# Patient Record
Sex: Female | Born: 1940 | Race: White | Hispanic: No | Marital: Married | Smoking: Never smoker
Health system: Southern US, Community
[De-identification: ages and names within clinical notes are randomized; demographics above are authoritative.]

## PROBLEM LIST (undated history)

## (undated) DIAGNOSIS — L905 Scar conditions and fibrosis of skin: Secondary | ICD-10-CM

## (undated) DIAGNOSIS — K219 Gastro-esophageal reflux disease without esophagitis: Secondary | ICD-10-CM

## (undated) DIAGNOSIS — K589 Irritable bowel syndrome without diarrhea: Secondary | ICD-10-CM

## (undated) DIAGNOSIS — Z889 Allergy status to unspecified drugs, medicaments and biological substances status: Secondary | ICD-10-CM

## (undated) HISTORY — DX: Allergy status to unspecified drugs, medicaments and biological substances: Z88.9

## (undated) HISTORY — DX: Irritable bowel syndrome, unspecified: K58.9

## (undated) HISTORY — DX: Scar conditions and fibrosis of skin: L90.5

## (undated) HISTORY — DX: Gastro-esophageal reflux disease without esophagitis: K21.9

---

## 1997-03-25 DIAGNOSIS — C4491 Basal cell carcinoma of skin, unspecified: Secondary | ICD-10-CM

## 1997-03-25 HISTORY — DX: Basal cell carcinoma of skin, unspecified: C44.91

## 1999-09-17 DIAGNOSIS — D229 Melanocytic nevi, unspecified: Secondary | ICD-10-CM

## 1999-09-17 HISTORY — DX: Melanocytic nevi, unspecified: D22.9

## 2002-12-30 HISTORY — PX: TUBAL LIGATION: SHX77

## 2006-07-28 DIAGNOSIS — C4492 Squamous cell carcinoma of skin, unspecified: Secondary | ICD-10-CM

## 2006-07-28 HISTORY — DX: Squamous cell carcinoma of skin, unspecified: C44.92

## 2012-05-04 ENCOUNTER — Ambulatory Visit (INDEPENDENT_AMBULATORY_CARE_PROVIDER_SITE_OTHER): Payer: Medicare Other | Admitting: Internal Medicine

## 2012-05-04 ENCOUNTER — Encounter: Payer: Self-pay | Admitting: Internal Medicine

## 2012-05-04 VITALS — BP 126/86 | HR 99 | Temp 98.5°F | Ht 64.0 in | Wt 151.8 lb

## 2012-05-04 DIAGNOSIS — R05 Cough: Secondary | ICD-10-CM

## 2012-05-04 DIAGNOSIS — J479 Bronchiectasis, uncomplicated: Secondary | ICD-10-CM | POA: Insufficient documentation

## 2012-05-04 DIAGNOSIS — J9819 Other pulmonary collapse: Secondary | ICD-10-CM

## 2012-05-04 NOTE — Progress Notes (Signed)
  Subjective:    Patient ID: Latoya Horton, female    DOB: 1941/08/22  MRN: [id number]  HPI  89 yowf never smoker with no respiratory problems x for eye allergies dx in [place] with allergy shots x 1990's  Only with occular complaints  until Dec 2012 with ? Viral pneumonia/ cough since >  referred by Dr Criss Alvine 05/04/2012 to pulmonary clinic for eval of abn cxr/chronic cough.   05/04/2012 1st pulmonary eval cc acute onset cough since Dec 2012 "viral infection" overall 90% better ? If inhaler helped but no longer on it,  ? With exercise if gets supine, does ok walking 2 miles outdoors. No flare of allergy symptoms with this illness. Also new L flank post lat chest only if lie on it. Mucus was yellow now white, was first thing in am then resolved.  Sleeping ok without nocturnal  or early am exacerbation  of respiratory  c/o's or need for noct saba. Also denies any obvious fluctuation of symptoms with weather or environmental changes or other aggravating or alleviating factors except as outlined above.    Review of Systems  Constitutional: Negative for fever, chills and unexpected weight change.  HENT: Negative for ear pain, nosebleeds, congestion, sore throat, rhinorrhea, sneezing, trouble swallowing, dental problem, voice change, postnasal drip and sinus pressure.   Eyes: Negative for visual disturbance.  Respiratory: Positive for cough. Negative for choking and shortness of breath.   Cardiovascular: Negative for chest pain and leg swelling.  Gastrointestinal: Negative for vomiting, abdominal pain and diarrhea.  Genitourinary: Negative for difficulty urinating.  Musculoskeletal: Negative for arthralgias.  Skin: Negative for rash.  Neurological: Negative for tremors, syncope and headaches.  Hematological: Does not bruise/bleed easily.       Objective:   Physical Exam  Pleasant amb wf nad 151 05/04/2012   HEENT: nl dentition, turbinates, and orophanx. Nl external ear canals without cough  reflex   NECK :  without JVD/Nodes/TM/ nl carotid upstrokes bilaterally   LUNGS: no acc muscle use, clear to A and P bilaterally without cough on insp or exp maneuvers   CV:  RRR  no s3 or murmur or increase in P2, no edema   ABD:  soft and nontender with nl excursion in the supine position. No bruits or organomegaly, bowel sounds nl  MS:  warm without deformities, calf tenderness, cyanosis or clubbing  SKIN: warm and dry without lesions    NEURO:  alert, approp, no deficits        Assessment & Plan:

## 2012-05-04 NOTE — Patient Instructions (Signed)
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  mucinex 600 mg 1-2 every 12 hours as needed for cough or congestion  GERD (REFLUX)  is an extremely common cause of respiratory symptoms, many times with no significant heartburn at all.    It can be treated with medication, but also with lifestyle changes including avoidance of late meals, excessive alcohol, smoking cessation, and avoid fatty foods, chocolate, peppermint, colas, red wine, and acidic juices such as orange juice.  NO MINT OR MENTHOL PRODUCTS SO NO COUGH DROPS  USE SUGARLESS CANDY INSTEAD (jolley ranchers or Stover's)  NO OIL BASED VITAMINS - use powdered substitutes.    Please schedule a follow up office visit in 6 weeks, call sooner if needed with pft's on return.

## 2012-05-05 NOTE — Assessment & Plan Note (Signed)
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 Bronchodilator trial

## 2012-06-17 ENCOUNTER — Ambulatory Visit (INDEPENDENT_AMBULATORY_CARE_PROVIDER_SITE_OTHER): Payer: Medicare Other | Admitting: Internal Medicine

## 2012-06-17 ENCOUNTER — Encounter: Payer: Self-pay | Admitting: Internal Medicine

## 2012-06-17 VITALS — BP 140/82 | HR 68 | Temp 97.4°F | Ht 64.0 in | Wt 150.0 lb

## 2012-06-17 DIAGNOSIS — R05 Cough: Secondary | ICD-10-CM

## 2012-06-17 DIAGNOSIS — R079 Chest pain, unspecified: Secondary | ICD-10-CM | POA: Insufficient documentation

## 2012-06-17 DIAGNOSIS — J479 Bronchiectasis, uncomplicated: Secondary | ICD-10-CM

## 2012-06-17 DIAGNOSIS — J9819 Other pulmonary collapse: Secondary | ICD-10-CM

## 2012-06-17 DIAGNOSIS — I209 Angina pectoris, unspecified: Secondary | ICD-10-CM

## 2012-06-17 LAB — PULMONARY FUNCTION TEST

## 2012-06-17 MED ORDER — NEBIVOLOL HCL 5 MG PO TABS: 5.0000 mg | ORAL_TABLET | Freq: Every day | ORAL | Status: DC

## 2012-06-17 MED ORDER — FAMOTIDINE 20 MG PO TABS: ORAL_TABLET | ORAL | Status: DC

## 2012-06-17 MED ORDER — OMEPRAZOLE MAGNESIUM 20 MG PO TBEC: 20.0000 mg | DELAYED_RELEASE_TABLET | Freq: Every day | ORAL | Status: DC

## 2012-06-17 NOTE — Patient Instructions (Addendum)
bystolic 5 mg one daily  Try prilosec 20mg   Take 30-60 min before first meal of the day and Pepcid 20 mg one bedtime until  Your return  GERD (REFLUX)  is an extremely common cause of respiratory symptoms, many times with no significant heartburn at all.    It can be treated with medication, but also with lifestyle changes including avoidance of late meals, excessive alcohol, smoking cessation, and avoid fatty foods, chocolate, peppermint, colas, red wine, and acidic juices such as orange juice.  NO MINT OR MENTHOL PRODUCTS SO NO COUGH DROPS  USE SUGARLESS CANDY INSTEAD (jolley ranchers or Stover's)  NO OIL BASED VITAMINS - use powdered substitutes.    Don't exercise to point where you're having discomfort  Schedule a cardiac evaluation in [place] as soon as possible for chest discomfort with exertion x 2 weeks.  Bronchiectasis =   you have scarring of your bronchial tubes which means that they don't function perfectly normally and mucus tends to pool in certain areas of your lung which can cause pneumonia and further scarring of your lung and bronchial tubes  Whenever you develop cough congestion take mucinex or mucinex dm > these will help keep the mucus loose and flowing but if your condition worsens you need to seek help immediately preferably here or somewhere inside the [facility] system to compare xrays ( worse = darker or bloody mucus or pain on breathing in)   Please schedule a follow up office visit in 6 weeks, call sooner if needed

## 2012-06-17 NOTE — Assessment & Plan Note (Signed)
Referred to [facility] 06/17/2012 p placed on bystolic 5 mg one daily  Discussed ddx but this is angina until proven otherwise; could also be gerd with ex if cards w/u neg

## 2012-06-17 NOTE — Assessment & Plan Note (Signed)
-   CXR 02/2009 min changes in RML vs Lingula     -  Outside CT 04/13/12 c/w rml > lingular  Bronchiectasis    -  PFT's .06/17/2012 FEV1  1.90 (96%) ratio 73 and no change p B2,  DLCO 98%  I had an extended discussion with the patient today lasting 15 to 20 minutes of a 25 minute visit on the following issues:   Discussed what this means in terms of symptoms and best rx to treat. Got pneumovax w/in last few years and per guidelines at age 71 probably doesn't need it again  Absence of sign airflow obstruction rules out alpha one def  Assoc daytime choking may be GERD > rx with ppi/h2 hs and diet and regroup in 6 weeks

## 2012-06-17 NOTE — Progress Notes (Signed)
PFT done today. 

## 2012-06-17 NOTE — Progress Notes (Signed)
  Subjective:    Patient ID: Latoya Horton, female    DOB: 01-28-1941  MRN: [id number]  Brief patient profile:  77 yowf never smoker with no respiratory problems x for eye allergies dx in [place] with allergy shots x 1990's  Only with occular complaints  until Dec 2012 with ? Viral pneumonia/ cough since >  referred by Dr Criss Alvine 05/04/2012 to pulmonary clinic for eval of abn cxr/chronic cough.   HPI 05/04/2012 1st pulmonary eval cc acute onset cough since Dec 2012 "viral infection" overall 90% better ? If inhaler helped but no longer on it,  ? With exercise if gets supine, does ok walking 2 miles outdoors. No flare of allergy symptoms with this illness. Also new L flank post lat chest only if lie on it. Mucus was yellow now white, was first thing in am then resolved. 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 mucinex 600 mg 1-2 every 12 hours as needed for cough or congestion GERD diet    06/17/2012 f/u ov/Jerod Mcquain cc midline cp at the end of exertion first noted x 2 weeks, has choking episodes also, only daytime with sense of too much throat mucus but no  Excess mucus production, following gerd diet with no overt hb or sinus symptoms or variability to symptoms.  CP does not radiate, some worse with deep breath, only with exertion, resolves when rests after a few min, no assoc nausea or diaphoresis.  Fm hx pos ihd father around 86, neg cardiac stress test "4 years ago in [place]".    Sleeping ok without nocturnal  or early am exacerbation  of respiratory  c/o's or need for noct saba. Also denies any obvious fluctuation of symptoms with weather or environmental changes or other aggravating or alleviating factors except as outlined above.  ROS  At present neg for  any significant sore throat, dysphagia, dental problems, itching, sneezing,  nasal congestion or excess/ purulent secretions, ear ache,   fever, chills, sweats,  unintended wt loss, classically lateralizing  Pleuritic , hemoptysis, palpitations, orthopnea pnd or leg swelling.  Also denies presyncope, palpitations, heartburn, abdominal pain, anorexia, nausea, vomiting, diarrhea  or change in bowel or urinary habits, change in stools or urine, dysuria,hematuria,  rash, arthralgias, visual complaints, headache, numbness weakness or ataxia or problems with walking or coordination. No noted change in mood/affect or memory.         Objective:  Physical Exam  Pleasant amb wf nad 151 05/04/2012  > 06/17/2012  150  HEENT: nl dentition, turbinates, and orophanx. Nl external ear canals without cough reflex   NECK :  without JVD/Nodes/TM/ nl carotid upstrokes bilaterally   LUNGS: no acc muscle use, clear to A and P bilaterally without cough on insp or exp maneuvers   CV:  RRR  no s3 or murmur or increase in P2, no edema   ABD:  soft and nontender with nl excursion in the supine position. No bruits or organomegaly, bowel sounds nl  MS:  warm without deformities, calf tenderness, cyanosis or clubbing  SKIN: warm and dry without lesions    NEURO:  alert, approp, no deficits        Assessment & Plan:

## 2012-06-18 ENCOUNTER — Telehealth: Payer: Self-pay | Admitting: Internal Medicine

## 2012-06-18 NOTE — Telephone Encounter (Signed)
I spoke with Latoya Horton and she is requesting to be referred to a cardiologists here instead of [place]. Latoya Horton states she will get her records from [place] and bring them with her to her OV. Dr. Sherene Sires who would you like Latoya Horton to see. Please advise thanks

## 2012-06-18 NOTE — Telephone Encounter (Signed)
Spoke with pt and notified that referral will be sent for cards eval at [facility]. She verbalized understanding and states nothing further needed.

## 2012-06-18 NOTE — Telephone Encounter (Signed)
Next avail [facility] for new onset exertional cp

## 2012-06-19 ENCOUNTER — Encounter: Payer: Self-pay | Admitting: Cardiology

## 2012-06-19 ENCOUNTER — Ambulatory Visit (INDEPENDENT_AMBULATORY_CARE_PROVIDER_SITE_OTHER): Payer: Medicare Other | Admitting: Cardiology

## 2012-06-19 VITALS — BP 134/82 | HR 62 | Ht 64.0 in | Wt 150.0 lb

## 2012-06-19 DIAGNOSIS — I209 Angina pectoris, unspecified: Secondary | ICD-10-CM

## 2012-06-19 DIAGNOSIS — R072 Precordial pain: Secondary | ICD-10-CM

## 2012-06-19 NOTE — Progress Notes (Signed)
HPI The patient presents for evaluation of burning in her throat. He adamantly says she has not had chest pain. She feels discomfort when she is out walking which he does routinely for exercise. She describes it as a burning she's taking a deep breath. She sees a pulmonologist lung scarring and was told this was not probably related to her lungs. She has been treated with omeprazole and Pepcid recently. She also had a slightly increased blood pressure and was started on diastolic. She reports that she does her walking everyday and on her second mile she'll notice the symptoms. She thinks it is mild. There is no associated nausea vomiting or diaphoresis. She doesn't have any neck, arm or substernal chest discomfort. She doesn't notice when he goes away. She never has it at rest. She doesn't describe any PND or orthopnea. She has no palpitations, presyncope or syncope. She's never had any reflux symptoms  Of note the patient does report that she had a negative exercise echocardiogram for unclear reasons in 2008 in [place].  Allergies  Allergen Reactions  . Penicillins     Syncope, rash    Current Outpatient Prescriptions  Medication Sig Dispense Refill  . Calcium Carbonate-Vitamin D (CALCIUM 600 + D PO) Take 1 tablet by mouth 2 (two) times daily.      . Cholecalciferol (D3-1000) 1000 UNITS capsule Take 2,000 Units by mouth daily.      . famotidine (PEPCID) 20 MG tablet One at bedtime      . Multiple Vitamins-Minerals (CENTRUM SILVER PO) Take 1 tablet by mouth daily.      . nebivolol (BYSTOLIC) 5 MG tablet Take 1 tablet (5 mg total) by mouth daily.  30 tablet  0  . omeprazole (PRILOSEC OTC) 20 MG tablet Take 1 tablet (20 mg total) by mouth daily.      Marland Kitchen terbinafine (LAMISIL) 250 MG tablet Take 250 mg by mouth daily.      Marland Kitchen UNABLE TO FIND Med Name: Allergy shots as directed        Past Medical History  Diagnosis Date  . Multiple allergies   . Scar tissue     of the lungs  . GERD  (gastroesophageal reflux disease)   . IBS (irritable bowel syndrome)     Past Surgical History  Procedure Date  . Tubal ligation 2004    Family History  Problem Relation Age of Onset  . Heart disease Brother 24    CHF  . Heart disease Father 82    Died age 42  . Breast cancer Paternal Aunt   . Ovarian cancer Maternal Aunt   . Diabetes Brother     History   Social History  . Marital Status: Married    Spouse Name: N/A    Number of Children: 1  . Years of Education: N/A   Occupational History  . Retired     Research scientist (medical)   Social History Main Topics  . Smoking status: Never Smoker   . Smokeless tobacco: Never Used  . Alcohol Use: No  . Drug Use: No  . Sexually Active: Not on file   Other Topics Concern  . Not on file   Social History Narrative   Lives with husband    ROS:  As stated in the HPI and negative for all other systems.  PHYSICAL EXAM BP 134/82  Pulse 62  Ht 5\' 4"  (1.626 m)  Wt 150 lb (68.04 kg)  BMI 25.75 kg/m2 GENERAL:  Well appearing  HEENT:  Pupils equal round and reactive, fundi not visualized, oral mucosa unremarkable NECK:  No jugular venous distention, waveform within normal limits, carotid upstroke brisk and symmetric, no bruits, no thyromegaly LYMPHATICS:  No cervical, inguinal adenopathy LUNGS:  Clear to auscultation bilaterally BACK:  No CVA tenderness CHEST:  Unremarkable HEART:  PMI not displaced or sustained,S1 and S2 within normal limits, no S3, no S4, no clicks, no rubs, no murmurs ABD:  Flat, positive bowel sounds normal in frequency in pitch, no bruits, no rebound, no guarding, no midline pulsatile mass, no hepatomegaly, no splenomegaly EXT:  2 plus pulses throughout, no edema, no cyanosis no clubbing SKIN:  No rashes no nodules NEURO:  Cranial nerves II through XII grossly intact, motor grossly intact throughout PSYCH:  Cognitively intact, oriented to person place and time   EKG:  Sinus rhythm, rate 72, axis within  normal limits, intervals within normal limits, no acute ST-T wave changes, PACs.  06/19/2012   ASSESSMENT AND PLAN

## 2012-06-19 NOTE — Patient Instructions (Addendum)
The current medical regimen is effective;  continue present plan and medications.  Your physician has requested that you have an exercise tolerance test. For further information please visit [email or web address]. Please also follow instruction sheet, as given.   

## 2012-06-19 NOTE — Assessment & Plan Note (Signed)
I'm going to start an exercise treadmill test. This seems to be a maximal test reproducing her usual exercises. I did have a discussion with her that if this is normal but her symptoms persist or worsen in the future she would need to come back for other testing. For now she will continue his therapies a Pepcid and Prilosec that had been prescribed as well. I think the pretest probability of obstructive coronary disease is low moderate but she does have a remarkable family history.

## 2012-06-26 ENCOUNTER — Encounter: Payer: Medicare Other | Admitting: Nurse Practitioner

## 2012-06-29 ENCOUNTER — Ambulatory Visit (INDEPENDENT_AMBULATORY_CARE_PROVIDER_SITE_OTHER): Payer: Medicare Other | Admitting: Nurse Practitioner

## 2012-06-29 ENCOUNTER — Encounter: Payer: Self-pay | Admitting: Physician Assistant

## 2012-06-29 DIAGNOSIS — I209 Angina pectoris, unspecified: Secondary | ICD-10-CM

## 2012-06-29 NOTE — Procedures (Signed)
Exercise Treadmill Test  Pre-Exercise Testing Evaluation Rhythm: normal sinus  Rate: 63   PR:  .13 QRS:  .08  QT:  .43 QTc: .44     Test  Exercise Tolerance Test Ordering MD: Angelina Sheriff, MD  Interpreting MD: Ward Givens , NP  Unique Test No: 1  Treadmill:  1  Indication for ETT: chest pain - rule out ischemia  Contraindication to ETT: No   Stress Modality: exercise - treadmill  Cardiac Imaging Performed: non   Protocol: standard Bruce - maximal  Max BP:  215/92  Max MPHR (bpm):  149 85% MPR (bpm):  126  MPHR obtained (bpm):  155 % MPHR obtained: 104%  Reached 85% MPHR (min:sec): 3:00 Total Exercise Time (min-sec):  6:00  Workload in METS:  7.0 Borg Scale: 13  Reason ETT Terminated:  desired heart rate attained    ST Segment Analysis At Rest: normal ST segments - no evidence of significant ST depression With Exercise: no evidence of significant ST depression  Other Information Arrhythmia:  No Angina during ETT:  absent (0) Quality of ETT:  diagnostic  ETT Interpretation:  normal - no evidence of ischemia by ST analysis  Comments: Good exercise tolerance with hypertensive response.  HR dropped from 155 to 139 within 1 minute of slowing down/leveling out.  No chest/throat pain or sob.  No acute ST/T changes.  Recommendations: Follow-up with PCP Lajoyce Lauber, MD) re: HTN.  She was recently placed on bystolic however didn't tolerate this.  She has her BP checked regularly at home and reports that it is always <140/90 @ rest.

## 2012-07-29 ENCOUNTER — Ambulatory Visit: Payer: Medicare Other | Admitting: Internal Medicine

## 2012-07-30 ENCOUNTER — Encounter: Payer: Self-pay | Admitting: Internal Medicine

## 2012-07-30 ENCOUNTER — Ambulatory Visit (INDEPENDENT_AMBULATORY_CARE_PROVIDER_SITE_OTHER): Payer: Medicare Other | Admitting: Internal Medicine

## 2012-07-30 VITALS — BP 130/82 | HR 69 | Temp 98.8°F | Ht 64.0 in | Wt 149.8 lb

## 2012-07-30 DIAGNOSIS — R079 Chest pain, unspecified: Secondary | ICD-10-CM

## 2012-07-30 DIAGNOSIS — J479 Bronchiectasis, uncomplicated: Secondary | ICD-10-CM

## 2012-07-30 NOTE — Assessment & Plan Note (Signed)
Referred to  Cardiology 06/17/2012 p placed on bystolic 5 mg one daily > could not tolerate - 06/30/12 GXT Good exercise tolerance with hypertensive response. HR dropped  from 155 to 139 within 1 minute of slowing down/leveling out. No  chest/throat pain or sob. No acute ST/T changes.  CP resolved prior to gxt and was not produced at study while on gerd rx so should continue at least the qam prilosec for now

## 2012-07-30 NOTE — Assessment & Plan Note (Addendum)
-   CXR 02/2009 min changes in RML vs Lingula     -  Outside CT 04/13/12 c/w rml > lingular  Bronchiectasis    -  PFT's 06/17/2012 FEV1  1.90 (96%) ratio 73 and no change p B2,  DLCO 98%  Much better overall on very conservative rx > no changes needed

## 2012-07-30 NOTE — Progress Notes (Signed)
Subjective:    Patient ID: Latoya Horton, female    DOB: 1941-10-07  MRN: [id number]  Brief patient profile:  48 yowf never smoker with no respiratory problems x for eye allergies dx in [place] with allergy shots x 1990's  Only with occular complaints  until Dec 2012 with ? Viral pneumonia/ cough since >  referred by Dr Criss Alvine 05/04/2012 to pulmonary clinic for eval of abn cxr/chronic cough.   HPI 05/04/2012 1st pulmonary eval cc acute onset cough since Dec 2012 "viral infection" overall 90% better ? If inhaler helped but no longer on it,  ? With exercise if gets supine, does ok walking 2 miles outdoors. No flare of allergy symptoms with this illness. Also new L flank post lat chest only if lie on it. Mucus was yellow now white, was first thing in am then resolved. 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 mucinex 600 mg 1-2 every 12 hours as needed for cough or congestion GERD diet    06/17/2012 f/u ov/[name] cc midline cp at the end of exertion first noted x 2 weeks, has choking episodes also, only daytime with sense of too much throat mucus but no  Excess mucus production, following gerd diet with no overt hb or sinus symptoms or variability to symptoms.  CP does not radiate, some worse with deep breath, only with exertion, resolves when rests after a few min, no assoc nausea or diaphoresis.  Fm hx pos ihd father around 4, neg cardiac stress test "4 years ago in [place]".   rec  bystolic 5 mg one daily Try prilosec 20mg   Take 30-60 min before first meal of the day and Pepcid 20 mg one bedtime until  Your return GERD diet  Don't exercise to point where you're having discomfort Schedule a cardiac evaluation in [place] as soon as possible for chest discomfort with exertion x 2 weeks. Bronchiectasis =   you have scarring of your bronchial tubes/ rx with mucinex prn   07/30/2012 f/u ov/[name] cc chest pain  resolved while on gerd rx before treadmill which was neg for ischemia,  No unusual cough, purulent sputum or sinus/hb symptoms on present rx.   Sleeping ok without nocturnal  or early am exacerbation  of respiratory  c/o's or need for noct saba. Also denies any obvious fluctuation of symptoms with weather or environmental changes or other aggravating or alleviating factors except as outlined above.  ROS  At present neg for  any significant sore throat, dysphagia, dental problems, itching, sneezing,  nasal congestion or excess/ purulent secretions, ear ache,   fever, chills, sweats, unintended wt loss, classically lateralizing  Pleuritic , hemoptysis, palpitations, orthopnea pnd or leg swelling.  Also denies presyncope, palpitations, heartburn, abdominal pain, anorexia, nausea, vomiting, diarrhea  or change in bowel or urinary habits, change in stools or urine, dysuria,hematuria,  rash, arthralgias, visual complaints, headache, numbness weakness or ataxia or problems with walking or coordination. No noted change in mood/affect or memory.         Objective:  Physical Exam  Pleasant amb wf nad 151 05/04/2012  > 06/17/2012  150 > 07/30/2012  149  HEENT: nl dentition, turbinates, and orophanx. Nl external ear canals without cough reflex   NECK :  without JVD/Nodes/TM/ nl carotid upstrokes bilaterally   LUNGS: no acc muscle use, clear to A and P bilaterally without cough on insp or exp maneuvers   CV:  RRR  no s3 or murmur or increase in P2, no edema   ABD:  soft and nontender with nl excursion in the supine position. No bruits or organomegaly, bowel sounds nl  MS:  warm without deformities, calf tenderness, cyanosis or clubbing  SKIN: warm and dry without lesions    NEURO:  alert, approp, no deficits        Assessment & Plan:

## 2012-07-30 NOTE — Patient Instructions (Addendum)
Continue  prilosec 20mg   Take 30-60 min before first meal of the day and add Pepcid 20 mg one bedtime if you have a flare in your respiratory flare   GERD (REFLUX)  is an extremely common cause of respiratory symptoms, many times with no significant heartburn at all.    It can be treated with medication, but also with lifestyle changes including avoidance of late meals, excessive alcohol, smoking cessation, and avoid fatty foods, chocolate, peppermint, colas, red wine, and acidic juices such as orange juice.  NO MINT OR MENTHOL PRODUCTS SO NO COUGH DROPS  USE SUGARLESS CANDY INSTEAD (jolley ranchers or Stover's)  NO OIL BASED VITAMINS - use powdered substitutes.   Bronchiectasis =   you have scarring of your bronchial tubes which means that they don't function perfectly normally and mucus tends to pool in certain areas of your lung which can cause pneumonia and further scarring of your lung and bronchial tubes  Whenever you develop cough congestion take mucinex or mucinex dm > these will help keep the mucus loose and flowing but if your condition worsens you need to seek help immediately preferably here or somewhere inside the [facility] system to compare xrays ( worse = darker or bloody mucus or pain on breathing in)   Please schedule a follow up visit in 3 months but call sooner if needed

## 2012-10-28 ENCOUNTER — Ambulatory Visit: Payer: Medicare Other | Admitting: Internal Medicine

## 2012-11-05 ENCOUNTER — Ambulatory Visit (INDEPENDENT_AMBULATORY_CARE_PROVIDER_SITE_OTHER)
Admission: RE | Admit: 2012-11-05 | Discharge: 2012-11-05 | Disposition: A | Discharge status: Home / Self Care | Payer: Medicare Other | Source: Ambulatory Visit | Attending: Internal Medicine | Admitting: Internal Medicine

## 2012-11-05 ENCOUNTER — Ambulatory Visit (INDEPENDENT_AMBULATORY_CARE_PROVIDER_SITE_OTHER): Payer: Medicare Other | Admitting: Internal Medicine

## 2012-11-05 ENCOUNTER — Encounter: Payer: Self-pay | Admitting: Internal Medicine

## 2012-11-05 VITALS — BP 130/80 | HR 55 | Temp 98.0°F | Ht 64.0 in | Wt 150.0 lb

## 2012-11-05 DIAGNOSIS — R911 Solitary pulmonary nodule: Secondary | ICD-10-CM

## 2012-11-05 DIAGNOSIS — R05 Cough: Secondary | ICD-10-CM

## 2012-11-05 DIAGNOSIS — R059 Cough, unspecified: Secondary | ICD-10-CM

## 2012-11-05 DIAGNOSIS — J479 Bronchiectasis, uncomplicated: Secondary | ICD-10-CM

## 2012-11-05 DIAGNOSIS — Z23 Encounter for immunization: Secondary | ICD-10-CM

## 2012-11-05 NOTE — Assessment & Plan Note (Signed)
-   CXR 02/2009 min changes in RML vs Lingula     -  Outside CT 04/13/12 c/w rml > lingular  Bronchiectasis    -  PFT's 06/17/2012 FEV1  1.90 (96%) ratio 73 and no change p B2,  DLCO 98%  Well controlled without significant airflow obstruction

## 2012-11-05 NOTE — Assessment & Plan Note (Signed)
-   LUL by cxr 11/05/2012 in pt with bronciectasis and never smoked so likely benign > f/u 6 months approp (placed in tickle)

## 2012-11-05 NOTE — Patient Instructions (Addendum)
mucinex dm up to 1200 mg every 12 hours  Continue  prilosec 20mg   Take 30-60 min before first meal of the day and add Pepcid 20 mg one bedtime if you have a flare in your respiratory flare   Please remember to go to the   x-ray department downstairs for your tests - we will call you with the results when they are available.  Please schedule a follow up visit in 6 months but call sooner if needed

## 2012-11-05 NOTE — Progress Notes (Signed)
Subjective:    Patient ID: Latoya Horton, female    DOB: 04-20-1941  MRN: [id number]   Brief patient profile:  56 yowf never smoker with no respiratory problems x for eye allergies dx in [place] with allergy shots x 1990's  Only with occular complaints  until Dec 2012 with ? Viral pneumonia/ cough since >  referred by Dr Criss Alvine 05/04/2012 to pulmonary clinic with CT confirmed bronchiectasis.  HPI 05/04/2012 1st pulmonary eval cc acute onset cough since Dec 2012 "viral infection" overall 90% better ? If inhaler helped but no longer on it,  ? With exercise if gets supine, does ok walking 2 miles outdoors. No flare of allergy symptoms with this illness. Also new L flank post lat chest only if lie on it. Mucus was yellow now white, was first thing in am then resolved. 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 mucinex 600 mg 1-2 every 12 hours as needed for cough or congestion GERD diet    06/17/2012 f/u ov/[name] cc midline cp at the end of exertion first noted x 2 weeks, has choking episodes also, only daytime with sense of too much throat mucus but no  Excess mucus production, following gerd diet with no overt hb or sinus symptoms or variability to symptoms.  CP does not radiate, some worse with deep breath, only with exertion, resolves when rests after a few min, no assoc nausea or diaphoresis.  Fm hx pos ihd father around 66, neg cardiac stress test "4 years ago in [place]".   rec  bystolic 5 mg one daily Try prilosec 20mg   Take 30-60 min before first meal of the day and Pepcid 20 mg one bedtime until  Your return GERD diet  Don't exercise to point where you're having discomfort Schedule a cardiac evaluation in [place] as soon as possible for chest discomfort with exertion x 2 weeks. Bronchiectasis =   you have scarring of your bronchial tubes/ rx with mucinex prn   07/30/2012 f/u ov/[name] cc chest pain resolved  while on gerd rx before treadmill which was neg for ischemia,  No unusual cough, purulent sputum or sinus/hb symptoms on present rx. rec Continue  prilosec 20mg   Take 30-60 min before first meal of the day and add Pepcid 20 mg one bedtime if you have a flare in your respiratory flare  GERD diet    11/05/2012 f/u ov/[name] cc breathing better, still with tickle, didn't try acid suppression, does use mucinex dm, never hemoptysis. No limiting sob. Some mucus production with floor exercises   Sleeping ok without nocturnal  or early am exacerbation  of respiratory  c/o's or need for noct saba. Also denies any obvious fluctuation of symptoms with weather or environmental changes or other aggravating or alleviating factors except as outlined above.  ROS  At present neg for  any significant sore throat, dysphagia, dental problems, itching, sneezing,  nasal congestion or excess/ purulent secretions, ear ache,   fever, chills, sweats, unintended wt loss, classically lateralizing  Pleuritic , hemoptysis, palpitations, orthopnea pnd or leg swelling.  Also denies presyncope, palpitations, heartburn, abdominal pain, anorexia, nausea, vomiting, diarrhea  or change in bowel or urinary habits, change in stools or urine, dysuria,hematuria,  rash, arthralgias, visual complaints, headache, numbness weakness or ataxia or problems with walking or coordination. No noted change in mood/affect or memory.         Objective:  Physical Exam  Pleasant amb wf nad 151 05/04/2012  > 06/17/2012  150 > 07/30/2012  149 > 150 11/05/2012   HEENT: nl dentition, turbinates, and orophanx. Nl external ear canals without cough reflex   NECK :  without JVD/Nodes/TM/ nl carotid upstrokes bilaterally   LUNGS: no acc muscle use, clear to A and P bilaterally without cough on insp or exp maneuvers   CV:  RRR  no s3 or murmur or increase in P2, no edema   ABD:  soft and nontender with nl excursion in the supine position. No bruits or  organomegaly, bowel sounds nl  MS:  warm without deformities, calf tenderness, cyanosis or clubbing  SKIN: warm and dry without lesions        CXR  11/05/2012 :  No acute cardiopulmonary abnormalities. 2. Indeterminant left upper lobe nodule.      Assessment & Plan:

## 2012-11-05 NOTE — Assessment & Plan Note (Signed)
Lack of cough resolution could mean a wrong diagnosis, persistence of the disease state, or inadequacy of currently available therapy (eg no rx available for non-acid gerd)   In her case she has persistence of dz state (bronchiectasis) but cough has more features typical of  Classic Upper airway cough syndrome, so named because it's frequently impossible to sort out how much is  CR/sinusitis with freq throat clearing (which can be related to primary GERD)   vs  causing  secondary (" extra esophageal")  GERD from wide swings in gastric pressure that occur with throat clearing, often  promoting self use of mint and menthol lozenges that reduce the lower esophageal sphincter tone and exacerbate the problem further in a cyclical fashion.   These are the same pts (now being labeled as having "irritable larynx syndrome" by some cough centers) who not infrequently have a history of having failed to tolerate ace inhibitors,  dry powder inhalers or biphosphonates or report having atypical reflux symptoms that don't respond to standard doses of PPI , and are easily confused as having aecopd or asthma flares by even experienced allergists/ pulmonologists.   For now rec max gerd rx when flares, consider ct sinus next

## 2012-11-06 NOTE — Progress Notes (Signed)
Quick Note:  Spoke with pt and notified of results per Dr. Wert. Pt verbalized understanding and denied any questions.  ______ 

## 2013-04-01 ENCOUNTER — Telehealth: Payer: Self-pay | Admitting: *Deleted

## 2013-04-01 NOTE — Telephone Encounter (Signed)
Spoke with pt and notified that she is due for ov with cxr She states this is fine, but will have to call me back since she is driving right now Will hold in my basket to make sure this gets done

## 2013-04-01 NOTE — Telephone Encounter (Signed)
Message copied by Christen Butter on Thu Apr 01, 2013  4:45 PM ------      Message from: Sandrea Hughs B      Created: Thu Nov 05, 2012  1:03 PM       Needs f/u cxr and ov by now ------

## 2013-04-02 NOTE — Telephone Encounter (Signed)
Patient calling back. Made appt for patient--04/17/13 @ 10:45.  [facility]

## 2013-04-13 ENCOUNTER — Encounter: Payer: Medicare Other | Admitting: Internal Medicine

## 2013-04-13 ENCOUNTER — Other Ambulatory Visit: Payer: Self-pay | Admitting: Internal Medicine

## 2013-04-13 DIAGNOSIS — R05 Cough: Secondary | ICD-10-CM

## 2013-04-13 DIAGNOSIS — J479 Bronchiectasis, uncomplicated: Secondary | ICD-10-CM

## 2013-04-14 ENCOUNTER — Ambulatory Visit (INDEPENDENT_AMBULATORY_CARE_PROVIDER_SITE_OTHER): Payer: Medicare Other | Admitting: Internal Medicine

## 2013-04-14 ENCOUNTER — Ambulatory Visit (INDEPENDENT_AMBULATORY_CARE_PROVIDER_SITE_OTHER)
Admission: RE | Admit: 2013-04-14 | Discharge: 2013-04-14 | Disposition: A | Discharge status: Home / Self Care | Payer: Medicare Other | Source: Ambulatory Visit | Attending: Internal Medicine | Admitting: Internal Medicine

## 2013-04-14 ENCOUNTER — Encounter: Payer: Self-pay | Admitting: Internal Medicine

## 2013-04-14 VITALS — BP 122/80 | HR 61 | Temp 96.1°F | Ht 64.0 in | Wt 154.0 lb

## 2013-04-14 DIAGNOSIS — J479 Bronchiectasis, uncomplicated: Secondary | ICD-10-CM

## 2013-04-14 DIAGNOSIS — R05 Cough: Secondary | ICD-10-CM

## 2013-04-14 DIAGNOSIS — R911 Solitary pulmonary nodule: Secondary | ICD-10-CM

## 2013-04-14 NOTE — Patient Instructions (Addendum)
Add pepcid 20 mg at bedtime x at least a month  To see if any of your symptoms improve  Please schedule a follow up office visit in 6 months, call sooner if needed with CT chest without contrast on return - please bring the disc from previous CT in [place] with you to compare.

## 2013-04-14 NOTE — Progress Notes (Signed)
Subjective:    Patient ID: Latoya Horton, female    DOB: [birth year]  MRN: [id number]   Brief patient profile:  89 yowf never smoker with no respiratory problems x for eye allergies dx in [place] with allergy shots x 1990's  Only with occular complaints  until Dec 2012 with ? Viral pneumonia/ cough since >  referred by Dr Criss Alvine 05/04/2012 to pulmonary clinic with CT confirmed bronchiectasis.  HPI 05/04/2012 1st pulmonary eval cc acute onset cough since Dec 2012 "viral infection" overall 90% better ? If inhaler helped but no longer on it,  ? With exercise if gets supine, does ok walking 2 miles outdoors. No flare of allergy symptoms with this illness. Also new L flank post lat chest only if lie on it. Mucus was yellow now white, was first thing in am then resolved. 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 mucinex 600 mg 1-2 every 12 hours as needed for cough or congestion GERD diet    06/17/2012 f/u ov/Osmara Drummonds cc midline cp at the end of exertion first noted x 2 weeks, has choking episodes also, only daytime with sense of too much throat mucus but no  Excess mucus production, following gerd diet with no overt hb or sinus symptoms or variability to symptoms.  CP does not radiate, some worse with deep breath, only with exertion, resolves when rests after a few min, no assoc nausea or diaphoresis.  Fm hx pos ihd father around 67, neg cardiac stress test "4 years ago in [place]".   rec  bystolic 5 mg one daily Try prilosec 20mg   Take 30-60 min before first meal of the day and Pepcid 20 mg one bedtime until  Your return GERD diet  Don't exercise to point where you're having discomfort Schedule a cardiac evaluation in [place] as soon as possible for chest discomfort with exertion x 2 weeks. Bronchiectasis =   you have scarring of your bronchial tubes/ rx with mucinex prn   07/30/2012 f/u ov/Breaunna Gottlieb cc chest pain resolved  while on gerd rx before treadmill which was neg for ischemia,  No unusual cough, purulent sputum or sinus/hb symptoms on present rx. rec Continue  prilosec 20mg   Take 30-60 min before first meal of the day and add Pepcid 20 mg one bedtime if you have a flare in your respiratory flare  GERD diet    11/05/2012 f/u ov/Colena Ketterman cc breathing better, still with tickle, didn't try acid suppression, does use mucinex dm, never hemoptysis. No limiting sob. Some mucus production with floor exercises. rec mucinex dm up to 1200 mg every 12 hours Continue  prilosec 20mg   Take 30-60 min before first meal of the day and add Pepcid 20 mg one bedtime if you have a flare in your respiratory flare.  04/14/2013 f/u ov/Kiko Ripp re bronchiectasis/ nodules Chief Complaint  Patient presents with  . Follow-up    Pt states that her breathing is unchanged. She has occ prod cough with white sputum, need to clear throat is less since the last visit.    mucus is white, one half tsp, does not wake her up, no blood with it, worse off prilosec, better with mucinex, worse when do yoga/crunches, not really limited by breathing.   Sleeping ok without nocturnal  or early am exacerbation  of respiratory  c/o's or need for noct saba. Also denies any obvious fluctuation of symptoms with weather or environmental changes or  other aggravating or alleviating factors except as outlined above.  ROS  At present neg for  any significant sore throat, dysphagia, dental problems, itching, sneezing,  nasal congestion or excess/ purulent secretions, ear ache,   fever, chills, sweats, unintended wt loss, classically lateralizing  Pleuritic , hemoptysis, palpitations, orthopnea pnd or leg swelling.  Also denies presyncope, palpitations, heartburn, abdominal pain, anorexia, nausea, vomiting, diarrhea  or change in bowel or urinary habits, change in stools or urine, dysuria,hematuria,  rash, arthralgias, visual complaints, headache, numbness weakness or ataxia  or problems with walking or coordination. No noted change in mood/affect or memory.         Objective:  Physical Exam  Pleasant amb wf nad 151 05/04/2012  > 06/17/2012  150 > 07/30/2012  149 > 150 11/05/2012 > 04/14/2013  154   HEENT: nl dentition, turbinates, and orophanx. Nl external ear canals without cough reflex   NECK :  without JVD/Nodes/TM/ nl carotid upstrokes bilaterally   LUNGS: no acc muscle use, clear to A and P bilaterally without cough on insp or exp maneuvers   CV:  RRR  no s3 or murmur or increase in P2, no edema   ABD:  soft and nontender with nl excursion in the supine position. No bruits or organomegaly, bowel sounds nl  MS:  warm without deformities, calf tenderness, cyanosis or clubbing  SKIN: warm and dry without lesions        CXR  04/14/2013 : Minimal chronic bronchitic changes. Persistent vague 7 mm left upper lobe nodular density      Assessment & Plan:

## 2013-04-15 ENCOUNTER — Ambulatory Visit: Payer: Medicare Other | Admitting: Internal Medicine

## 2013-04-16 NOTE — Assessment & Plan Note (Signed)
-   CXR 02/2009 min changes in RML vs Lingula     -  Outside CT 04/13/12 c/w rml > lingular  Bronchiectasis    -  PFT's 06/17/2012 FEV1  1.90 (96%) ratio 73 and no change p B2,  DLCO 98%  Adequate control on present rx, reviewed

## 2013-04-16 NOTE — Assessment & Plan Note (Addendum)
-   LUL by cxr 11/05/2012 in pt with bronciectasis and never smoked so likely benign > f/u 6 months approp> no change 04/16/2013   Discussed in detail all the  indications, usual  risks and alternatives  relative to the benefits with patient who agrees to proceed with conservative f/u with ct here and compare to previous study from [facility] in 6 months

## 2013-04-24 ENCOUNTER — Encounter: Payer: Self-pay | Admitting: Internal Medicine

## 2013-07-27 DIAGNOSIS — N8110 Cystocele, unspecified: Secondary | ICD-10-CM | POA: Insufficient documentation

## 2013-09-09 ENCOUNTER — Telehealth: Payer: Self-pay | Admitting: Internal Medicine

## 2013-09-09 DIAGNOSIS — J479 Bronchiectasis, uncomplicated: Secondary | ICD-10-CM

## 2013-09-09 DIAGNOSIS — R911 Solitary pulmonary nodule: Secondary | ICD-10-CM

## 2013-09-09 NOTE — Telephone Encounter (Signed)
Ct appt@8 :30am on 10/11/13@[facility]  pt is aware Tobe Sos

## 2013-09-09 NOTE — Telephone Encounter (Signed)
I have placed order and will forward to [facility];s to make them aware CT ordered. Pt appt 10/11/13 at 10 AM. Please advise [facility]'s thanks

## 2013-09-10 ENCOUNTER — Encounter: Payer: Self-pay | Admitting: Internal Medicine

## 2013-10-11 ENCOUNTER — Encounter: Payer: Self-pay | Admitting: Internal Medicine

## 2013-10-11 ENCOUNTER — Ambulatory Visit (INDEPENDENT_AMBULATORY_CARE_PROVIDER_SITE_OTHER)
Admission: RE | Admit: 2013-10-11 | Discharge: 2013-10-11 | Disposition: A | Discharge status: Home / Self Care | Payer: Medicare Other | Source: Ambulatory Visit | Attending: Internal Medicine | Admitting: Internal Medicine

## 2013-10-11 ENCOUNTER — Ambulatory Visit (INDEPENDENT_AMBULATORY_CARE_PROVIDER_SITE_OTHER): Payer: Medicare Other | Admitting: Internal Medicine

## 2013-10-11 VITALS — BP 132/90 | HR 90 | Temp 98.2°F | Ht 63.5 in | Wt 149.2 lb

## 2013-10-11 DIAGNOSIS — R911 Solitary pulmonary nodule: Secondary | ICD-10-CM

## 2013-10-11 DIAGNOSIS — J479 Bronchiectasis, uncomplicated: Secondary | ICD-10-CM

## 2013-10-11 DIAGNOSIS — Z23 Encounter for immunization: Secondary | ICD-10-CM

## 2013-10-11 MED ORDER — AZITHROMYCIN 250 MG PO TABS: ORAL_TABLET | ORAL | Status: DC

## 2013-10-11 NOTE — Progress Notes (Signed)
Subjective:    Patient ID: Latoya Horton, female    DOB: 1941-04-13  MRN: [id number]   Brief patient profile:  46 yowf never smoker with no respiratory problems x for eye allergies dx in [place] with allergy shots x 1990's  Only with occular complaints  until Dec 2012 with ? Viral pneumonia/ cough since >  referred by Dr Criss Alvine 05/04/2012 to pulmonary clinic with CT confirmed bronchiectasis.  HPI 05/04/2012 1st pulmonary eval cc acute onset cough since Dec 2012 "viral infection" overall 90% better ? If inhaler helped but no longer on it,  ? With exercise if gets supine, does ok walking 2 miles outdoors. No flare of allergy symptoms with this illness. Also new L flank post lat chest only if lie on it. Mucus was yellow now white, was first thing in am then resolved. 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 mucinex 600 mg 1-2 every 12 hours as needed for cough or congestion GERD diet    06/17/2012 f/u ov/Wert cc midline cp at the end of exertion first noted x 2 weeks, has choking episodes also, only daytime with sense of too much throat mucus but no  Excess mucus production, following gerd diet with no overt hb or sinus symptoms or variability to symptoms.  CP does not radiate, some worse with deep breath, only with exertion, resolves when rests after a few min, no assoc nausea or diaphoresis.  Fm hx pos ihd father around 49, neg cardiac stress test "4 years ago in [place]".   rec  bystolic 5 mg one daily Try prilosec 20mg   Take 30-60 min before first meal of the day and Pepcid 20 mg one bedtime until  Your return GERD diet  Don't exercise to point where you're having discomfort Schedule a cardiac evaluation in [place] as soon as possible for chest discomfort with exertion x 2 weeks. Bronchiectasis =   you have scarring of your bronchial tubes/ rx with mucinex prn   07/30/2012 f/u ov/Wert cc chest pain resolved  while on gerd rx before treadmill which was neg for ischemia,  No unusual cough, purulent sputum or sinus/hb symptoms on present rx. rec Continue  prilosec 20mg   Take 30-60 min before first meal of the day and add Pepcid 20 mg one bedtime if you have a flare in your respiratory flare  GERD diet    11/05/2012 f/u ov/Wert cc breathing better, still with tickle, didn't try acid suppression, does use mucinex dm, never hemoptysis. No limiting sob. Some mucus production with floor exercises. rec mucinex dm up to 1200 mg every 12 hours Continue  prilosec 20mg   Take 30-60 min before first meal of the day and add Pepcid 20 mg one bedtime if you have a flare in your respiratory flare.  04/14/2013 f/u ov/Wert re bronchiectasis/ nodules Chief Complaint  Patient presents with  . Follow-up    Pt states that her breathing is unchanged. She has occ prod cough with white sputum, need to clear throat is less since the last visit.    mucus is white, one half tsp, does not wake her up, no blood with it, worse off prilosec, better with mucinex, worse when do yoga/crunches, not really limited by breathing. rec Add pepcid 20 mg at bedtime x at least a month  To see if any of your symptoms improve   10/11/2013 f/u ov/Wert re: bronchiectasis/ copd Chief Complaint  Patient presents  with  . Follow-up    Pt c/o sore throat, cough, aches and fatigued x 3 wks. Cough is prod with minimal clear sputum.   no abx so far, no sob    No obvious day to day or daytime variabilty or assoc  cp or chest tightness, subjective wheeze overt sinus or hb symptoms. No unusual exp hx or h/o childhood pna/ asthma or knowledge of premature birth.  Sleeping ok without nocturnal  or early am exacerbation  of respiratory  c/o's or need for noct saba. Also denies any obvious fluctuation of symptoms with weather or environmental changes or other aggravating or alleviating factors except as outlined above   Current Medications, Allergies,  Complete Past Medical History, Past Surgical History, Family History, and Social History were reviewed in [employer] record.  ROS  The following are not active complaints unless bolded sore throat, dysphagia, dental problems, itching, sneezing,  nasal congestion or excess/ purulent secretions, ear ache,   fever, chills, sweats, unintended wt loss, pleuritic or exertional cp, hemoptysis,  orthopnea pnd or leg swelling, presyncope, palpitations, heartburn, abdominal pain, anorexia, nausea, vomiting, diarrhea  or change in bowel or urinary habits, change in stools or urine, dysuria,hematuria,  rash, arthralgias, visual complaints, headache, numbness weakness or ataxia or problems with walking or coordination,  change in mood/affect or memory.              Objective:  Physical Exam  Pleasant amb wf nad 151 05/04/2012  > 06/17/2012  150 > 07/30/2012  149 > 150 11/05/2012 > 04/14/2013  154 > 10/11/2013  149   HEENT: nl dentition, turbinates, and orophanx. Nl external ear canals without cough reflex   NECK :  without JVD/Nodes/TM/ nl carotid upstrokes bilaterally   LUNGS: no acc muscle use, clear to A and P bilaterally without cough on insp or exp maneuvers   CV:  RRR  no s3 or murmur or increase in P2, no edema   ABD:  soft and nontender with nl excursion in the supine position. No bruits or organomegaly, bowel sounds nl  MS:  warm without deformities, calf tenderness, cyanosis or clubbing  SKIN: warm and dry without lesions        CXR  04/14/2013 : Minimal chronic bronchitic changes. Persistent vague 7 mm left upper lobe nodular density  10/11/2013 CT chest 1. The suspected pulmonary nodule on chest x-ray represents a small focal area of parenchymal and pleural scarring posteriorly in the left apex. There is no worrisome pulmonary nodule. 2. Multiple small areas of tree-in-bud infiltrates in the right lung. This is nonspecific but has been associated with  mycobacterium infections. 3. Chronic bronchiectasis and atelectasis in the right middle lobe and lingula     Assessment & Plan:

## 2013-10-11 NOTE — Patient Instructions (Addendum)
Try prilosec 20mg   Take 30-60 min before first meal of the day and Pepcid 20 mg one bedtime until cough is completely gone for at least a week without the need for cough suppression  zpak should be adequate but if not feeling better in a week call me for stronger antibiotics (levaquin 750 x 5 d)  For cough mucinex dm up to  1200 mg every 12 hours as needed   Please schedule a follow up visit in 6 months but call sooner if needed

## 2013-10-12 NOTE — Assessment & Plan Note (Signed)
-   LUL by cxr 11/05/2012 in pt with bronciectasis and never smoked so likely benign  10/11/13 CT c/w inflammatory nodules assoc with bronchiectasis (see bronchiectasis) > no further CTs rec

## 2013-10-12 NOTE — Assessment & Plan Note (Signed)
-   CXR 02/2009 min changes in RML vs Lingula     -  Outside CT 04/13/12 c/w rml > lingular  Bronchiectasis    -  PFT's 06/17/2012 FEV1  1.90 (96%) ratio 73 and no change p B2,  DLCO 98%  - CT 10/11/13    1. The suspected pulmonary nodule on chest x-ray represents a small focal area of parenchymal and pleural scarring posteriorly in the left apex. There is no worrisome pulmonary nodule. 2. Multiple small areas of tree-in-bud infiltrates in the right lung. This is nonspecific but has been associated with mycobacterium infections. 3. Chronic bronchiectasis and atelectasis in the right middle lobe and lingula  I had an extended discussion with the patient today lasting 15 to 20 minutes of a 25 minute visit on the following issues:  From this point forward no further ct's needed.  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

## 2013-12-08 IMAGING — CR DG CHEST 2V
2 series · 2 of 2 positions shown · non-contrast
Comparison: None

CLINICAL DATA: Cough

CHEST - 2 VIEW

[view not recorded (1 of 2)]
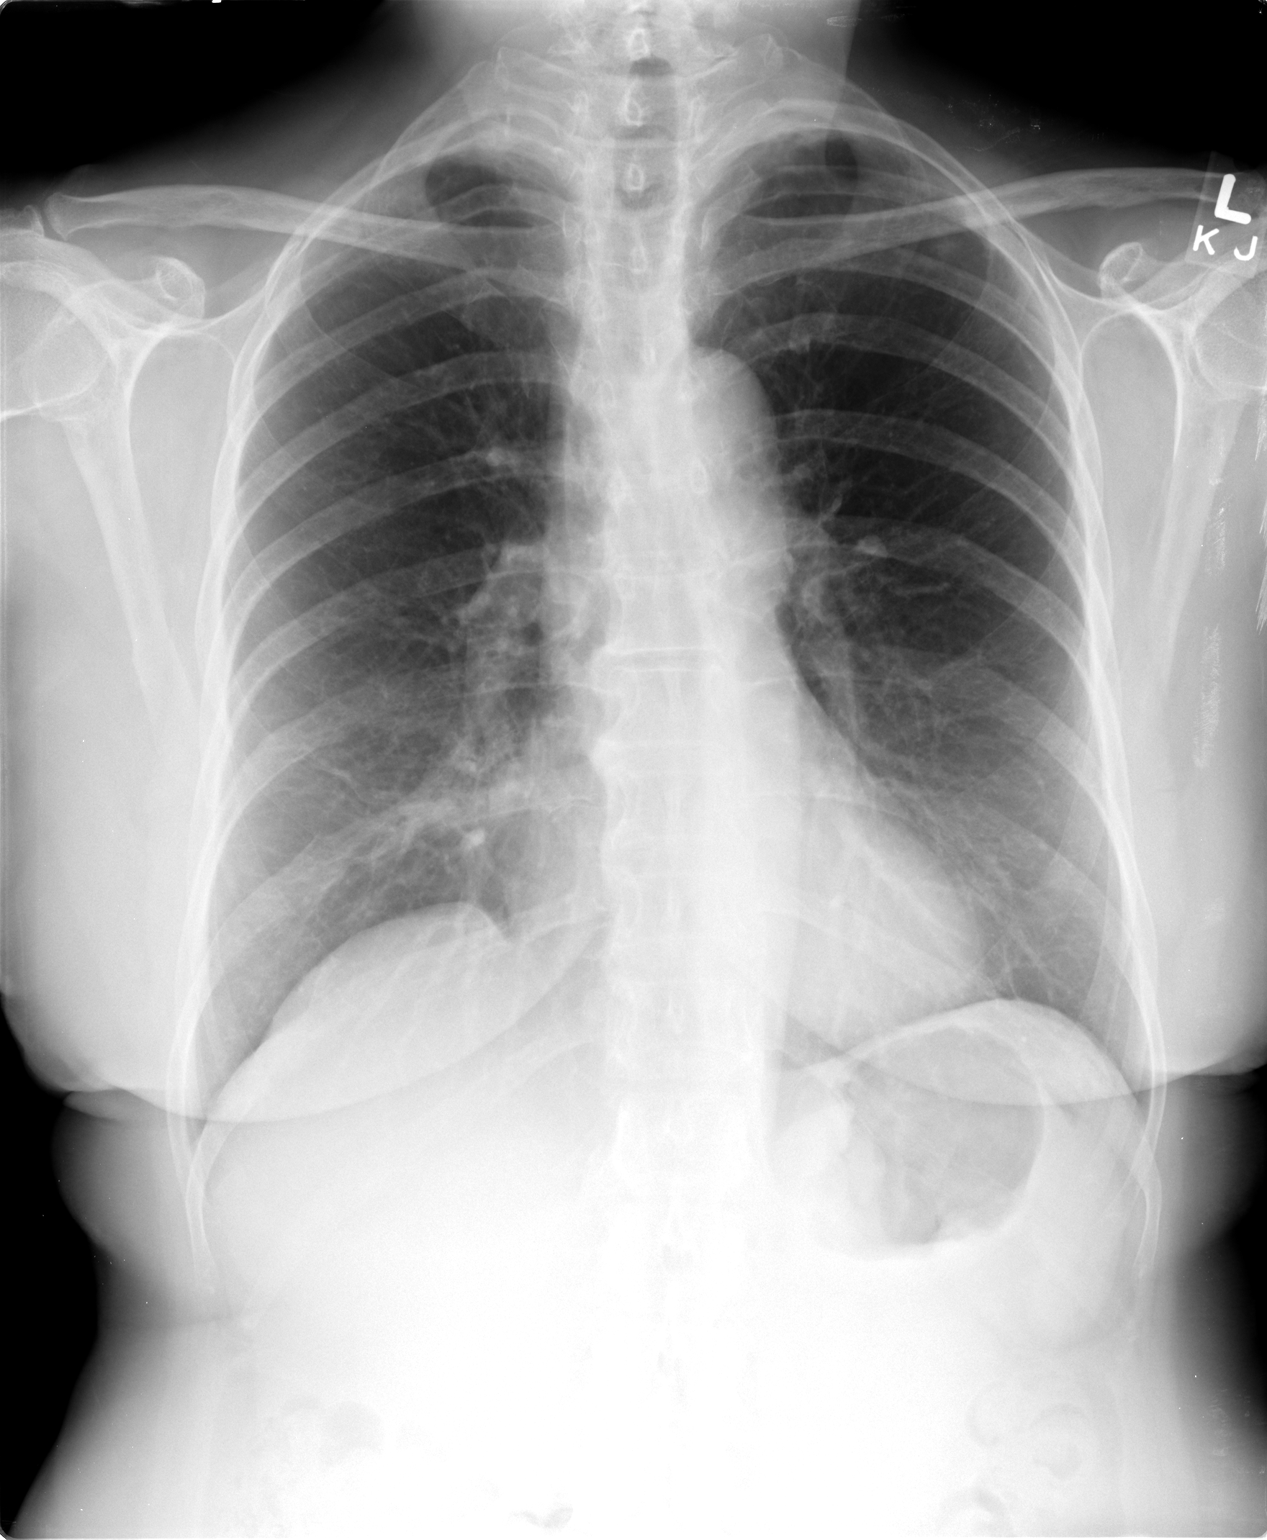

[view not recorded (2 of 2)]
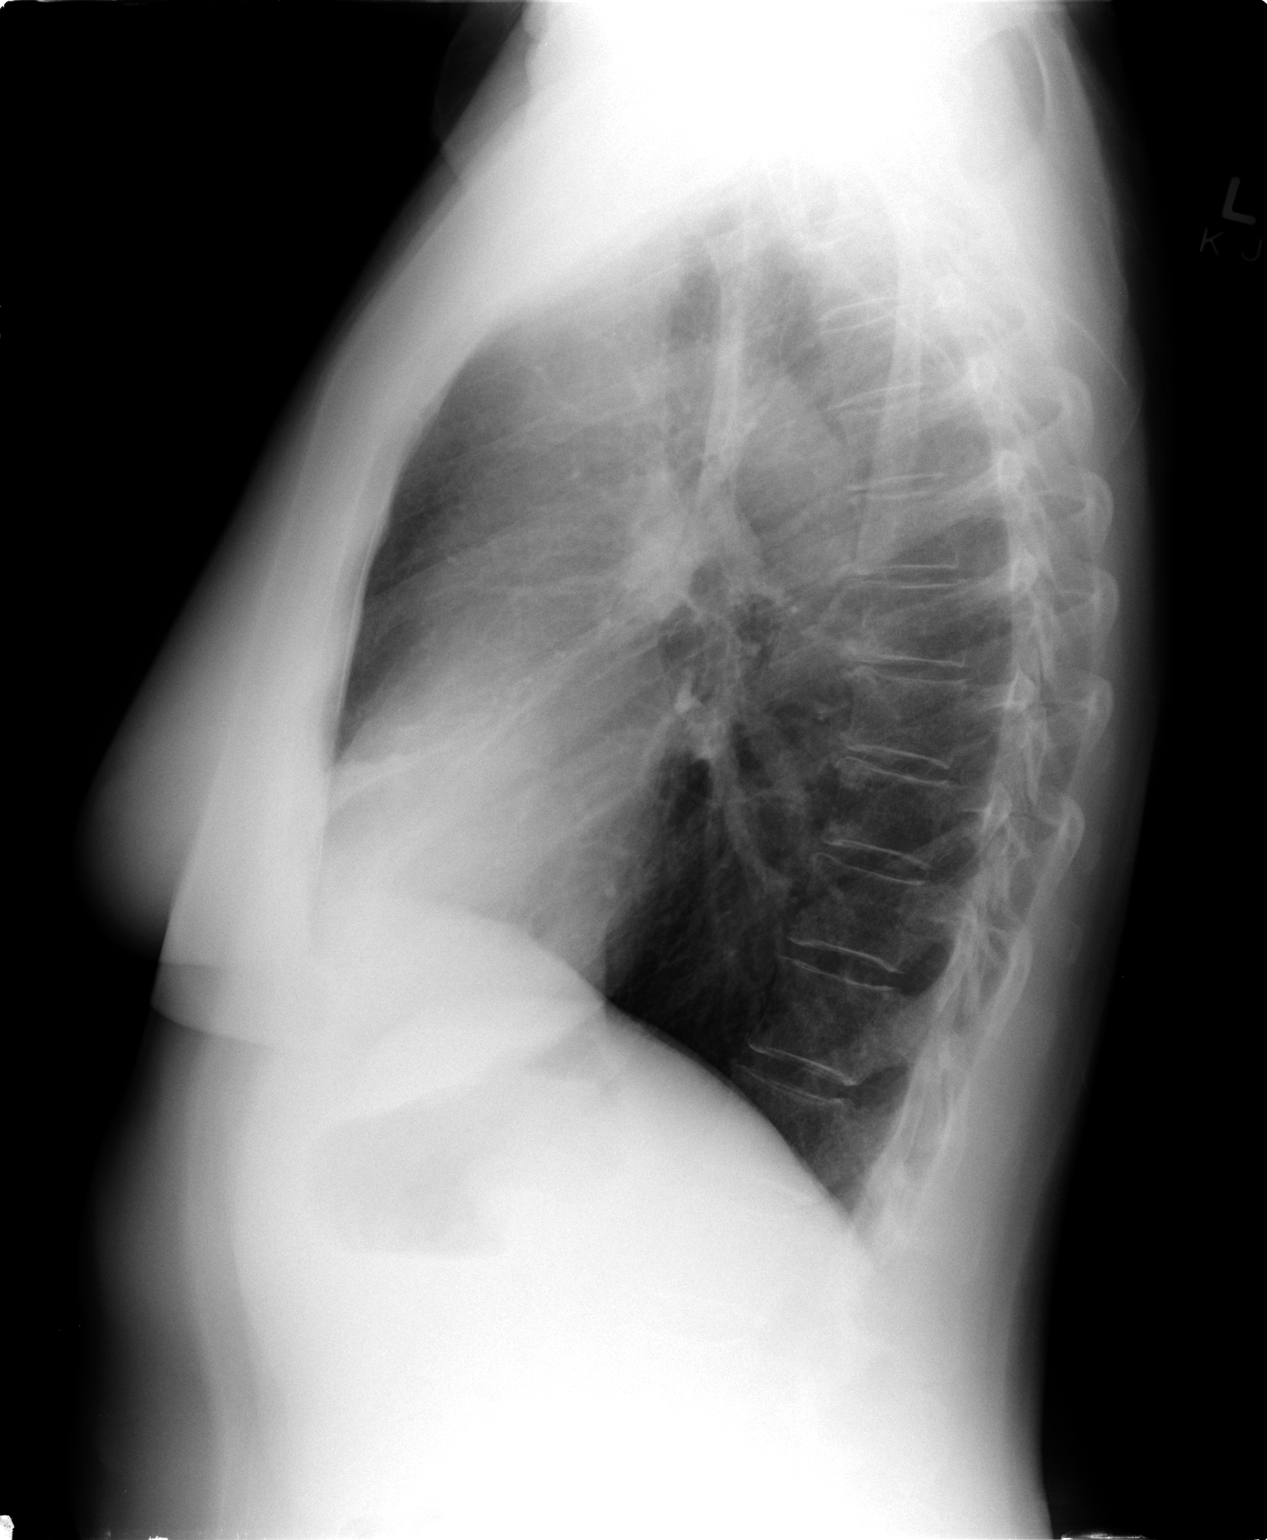

[2 of 2 positions shown; findings below may reference images not displayed]

FINDINGS: Heart size and mediastinal contours appear normal.  There
is no pleural effusion or edema.  No airspace consolidation
identified nodular density in the left apex is identified measuring
approximately 1 cm.  This is indeterminate.  Review of the
visualized osseous structures is unremarkable.
IMPRESSION: 1.  No acute cardiopulmonary abnormalities.
2.  Indeterminant left upper lobe nodule.  Advise further
evaluation with CT of the chest

These results will be called to the ordering clinician or
representative by the Radiologist Assistant, and communication
documented in the PACS Dashboard.

## 2014-02-05 ENCOUNTER — Telehealth: Payer: Self-pay | Admitting: Internal Medicine

## 2014-02-05 NOTE — Telephone Encounter (Signed)
On call- Hx bronchiectasis/ Wert. Flu 3 weeks ago. Went to [facility] 3 days ago dx acute bronchitis/ sinusitis Rx'd Z pak. She declined CXR. Now feeling worse and asks ok to let them do CXR. I recommended she see them for re-eval over weekend and ok to let them CXR if they need.

## 2014-04-12 ENCOUNTER — Ambulatory Visit (INDEPENDENT_AMBULATORY_CARE_PROVIDER_SITE_OTHER): Payer: Medicare Other | Admitting: Internal Medicine

## 2014-04-12 ENCOUNTER — Encounter: Payer: Self-pay | Admitting: Internal Medicine

## 2014-04-12 VITALS — BP 144/80 | HR 65 | Temp 97.9°F | Ht 63.0 in | Wt 152.4 lb

## 2014-04-12 DIAGNOSIS — J479 Bronchiectasis, uncomplicated: Secondary | ICD-10-CM

## 2014-04-12 DIAGNOSIS — Z23 Encounter for immunization: Secondary | ICD-10-CM

## 2014-04-12 MED ORDER — DOXYCYCLINE HYCLATE 100 MG PO TABS: ORAL_TABLET | ORAL | Status: DC

## 2014-04-12 NOTE — Progress Notes (Signed)
Subjective:    Patient ID: Latoya Horton, female    DOB: November 08, 1941  MRN: [id number]   Brief patient profile:  31 yowf never smoker with no respiratory problems x for eye allergies dx in [place] with allergy shots x 1990's  Only with occular complaints  until Dec 2012 with ? Viral pneumonia/ cough since >  referred by Dr Alfonse Spruce 05/04/2012 to pulmonary clinic with CT confirmed bronchiectasis.  History of Present Illness  05/04/2012 1st pulmonary eval cc acute onset cough since Dec 2012 "viral infection" overall 90% better ? If inhaler helped but no longer on it,  ? With exercise if gets supine, does ok walking 2 miles outdoors. No flare of allergy symptoms with this illness. Also new L flank post lat chest only if lie on it. Mucus was yellow now white, was first thing in am then resolved. 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 mucinex 600 mg 1-2 every 12 hours as needed for cough or congestion GERD diet    06/17/2012 f/u ov/Latoya Horton cc midline cp at the end of exertion first noted x 2 weeks, has choking episodes also, only daytime with sense of too much throat mucus but no  Excess mucus production, following gerd diet with no overt hb or sinus symptoms or variability to symptoms.  CP does not radiate, some worse with deep breath, only with exertion, resolves when rests after a few min, no assoc nausea or diaphoresis.  Fm hx pos ihd father around 37, neg cardiac stress test "4 years ago in [place]".   rec  bystolic 5 mg one daily Try prilosec 20mg   Take 30-60 min before first meal of the day and Pepcid 20 mg one bedtime until  Your return GERD diet  Don't exercise to point where you're having discomfort Schedule a cardiac evaluation in [place] as soon as possible for chest discomfort with exertion x 2 weeks. Bronchiectasis =   you have scarring of your bronchial tubes/ rx with mucinex prn   07/30/2012 f/u  ov/Latoya Horton cc chest pain resolved while on gerd rx before treadmill which was neg for ischemia,  No unusual cough, purulent sputum or sinus/hb symptoms on present rx. rec Continue  prilosec 20mg   Take 30-60 min before first meal of the day and add Pepcid 20 mg one bedtime if you have a flare in your respiratory flare  GERD diet    11/05/2012 f/u ov/Latoya Horton cc breathing better, still with tickle, didn't try acid suppression, does use mucinex dm, never hemoptysis. No limiting sob. Some mucus production with floor exercises. rec mucinex dm up to 1200 mg every 12 hours Continue  prilosec 20mg   Take 30-60 min before first meal of the day and add Pepcid 20 mg one bedtime if you have a flare in your respiratory flare.  04/14/2013 f/u ov/Latoya Horton re bronchiectasis/ nodules Chief Complaint  Patient presents with  . Follow-up    Pt states that her breathing is unchanged. She has occ prod cough with white sputum, need to clear throat is less since the last visit.    mucus is white, one half tsp, does not wake her up, no blood with it, worse off prilosec, better with mucinex, worse when do yoga/crunches, not really limited by breathing. rec Add pepcid 20 mg at bedtime x at least a month  To see if any of your symptoms improve   10/11/2013 f/u ov/Latoya Horton re: bronchiectasis/ copd Chief  Complaint  Patient presents with  . Follow-up    Pt c/o sore throat, cough, aches and fatigued x 3 wks. Cough is prod with minimal clear sputum.   no abx so far, no sob  rec Try prilosec 20mg   Take 30-60 min before first meal of the day and Pepcid 20 mg one bedtime until cough is completely gone for at least a week without the need for cough suppression zpak should be adequate but if not feeling better in a week call me for stronger antibiotics (levaquin 750 x 5 d) For cough mucinex dm up to  1200 mg every 12 hours as needed        04/12/2014 f/u ov/Latoya Horton re: copd/bronchiectasis Chief Complaint  Patient presents with  .  Follow-up    Pt states breathing improved but then got the flu and bronchitis in Jan. Pts breathing has now improved overall. Denies SOB, cough and CP.   Not limited by breathing from desired activities   Took zpak then second abx and now back to baseline cough   No obvious day to day or daytime variabilty or assoc  cp or chest tightness, subjective wheeze overt sinus or hb symptoms. No unusual exp hx or h/o childhood pna/ asthma or knowledge of premature birth.  Sleeping ok without nocturnal  or early am exacerbation  of respiratory  c/o's or need for noct saba. Also denies any obvious fluctuation of symptoms with weather or environmental changes or other aggravating or alleviating factors except as outlined above   Current Medications, Allergies, Complete Past Medical History, Past Surgical History, Family History, and Social History were reviewed in [employer] record.  ROS  The following are not active complaints unless bolded sore throat, dysphagia, dental problems, itching, sneezing,  nasal congestion or excess/ purulent secretions, ear ache,   fever, chills, sweats, unintended wt loss, pleuritic or exertional cp, hemoptysis,  orthopnea pnd or leg swelling, presyncope, palpitations, heartburn, abdominal pain, anorexia, nausea, vomiting, diarrhea  or change in bowel or urinary habits, change in stools or urine, dysuria,hematuria,  rash, arthralgias, visual complaints, headache, numbness weakness or ataxia or problems with walking or coordination,  change in mood/affect or memory.              Objective:  Physical Exam  Pleasant amb wf nad 151 05/04/2012  > 06/17/2012  150 > 07/30/2012  149 > 150 11/05/2012 > 04/14/2013  154 > 10/11/2013  149 > 04/12/2014  152   HEENT: nl dentition, turbinates, and orophanx. Nl external ear canals without cough reflex   NECK :  without JVD/Nodes/TM/ nl carotid upstrokes bilaterally   LUNGS: no acc muscle use, clear to A and P  bilaterally without cough on insp or exp maneuvers   CV:  RRR  no s3 or murmur or increase in P2, no edema   ABD:  soft and nontender with nl excursion in the supine position. No bruits or organomegaly, bowel sounds nl  MS:  warm without deformities, calf tenderness, cyanosis or clubbing  SKIN: warm and dry without lesions        CXR  04/14/2013 : Minimal chronic bronchitic changes. Persistent vague 7 mm left upper lobe nodular density  10/11/2013 CT chest 1. The suspected pulmonary nodule on chest x-ray represents a small focal area of parenchymal and pleural scarring posteriorly in the left apex. There is no worrisome pulmonary nodule. 2. Multiple small areas of tree-in-bud infiltrates in the right lung. This is nonspecific but has  been associated with mycobacterium infections. 3. Chronic bronchiectasis and atelectasis in the right middle lobe and lingula     Assessment & Plan:

## 2014-04-12 NOTE — Patient Instructions (Addendum)
Prevnar 13 today  Next flare of nasty mucus > try doxycycline 100 mg twice daily before eating with glass of water  For cough mucinex dm up to  1200 mg every 12 hours as needed and immediately start Try prilosec 20mg   Take 30-60 min before first meal of the day and Pepcid 20 mg one bedtime until cough is completely gone for at least a week without the need for cough suppression  I think of reflux for chronic cough like I do oxygen for fire (doesn't cause the fire but once you get the oxygen suppressed it usually goes away regardless of the exact cause).   Please schedule a follow up visit in 6 months but call sooner if needed with cxr on return

## 2014-04-16 NOTE — Assessment & Plan Note (Signed)
-   CXR 02/2009 min changes in RML vs Lingula     -  Outside CT 04/13/12 c/w rml > lingular  Bronchiectasis    -  PFT's 06/17/2012 FEV1  1.90 (96%) ratio 73 and no change p B2,  DLCO 98%  - CT 10/11/13    1. The suspected pulmonary nodule on chest x-ray represents a small focal area of parenchymal and pleural scarring posteriorly in the left apex. There is no worrisome pulmonary nodule. 2. Multiple small areas of tree-in-bud infiltrates in the right lung. This is nonspecific but has been associated with mycobacterium infections. 3. Chronic bronchiectasis and atelectasis in the right middle lobe and lingula   Adequate control on present rx, reviewed > no change in rx needed x needs prevnar > given     Each maintenance medication was reviewed in detail including most importantly the difference between maintenance and as needed and under what circumstances the prns are to be used.  Please see instructions for details which were reviewed in writing and the patient given a copy.

## 2014-05-17 IMAGING — CR DG CHEST 2V
2 series · 2 of 2 positions shown · non-contrast
Comparison: 11/05/2012

CLINICAL DATA: Cough, follow up bronchiectasis

CHEST - 2 VIEW

[view not recorded (1 of 2)]
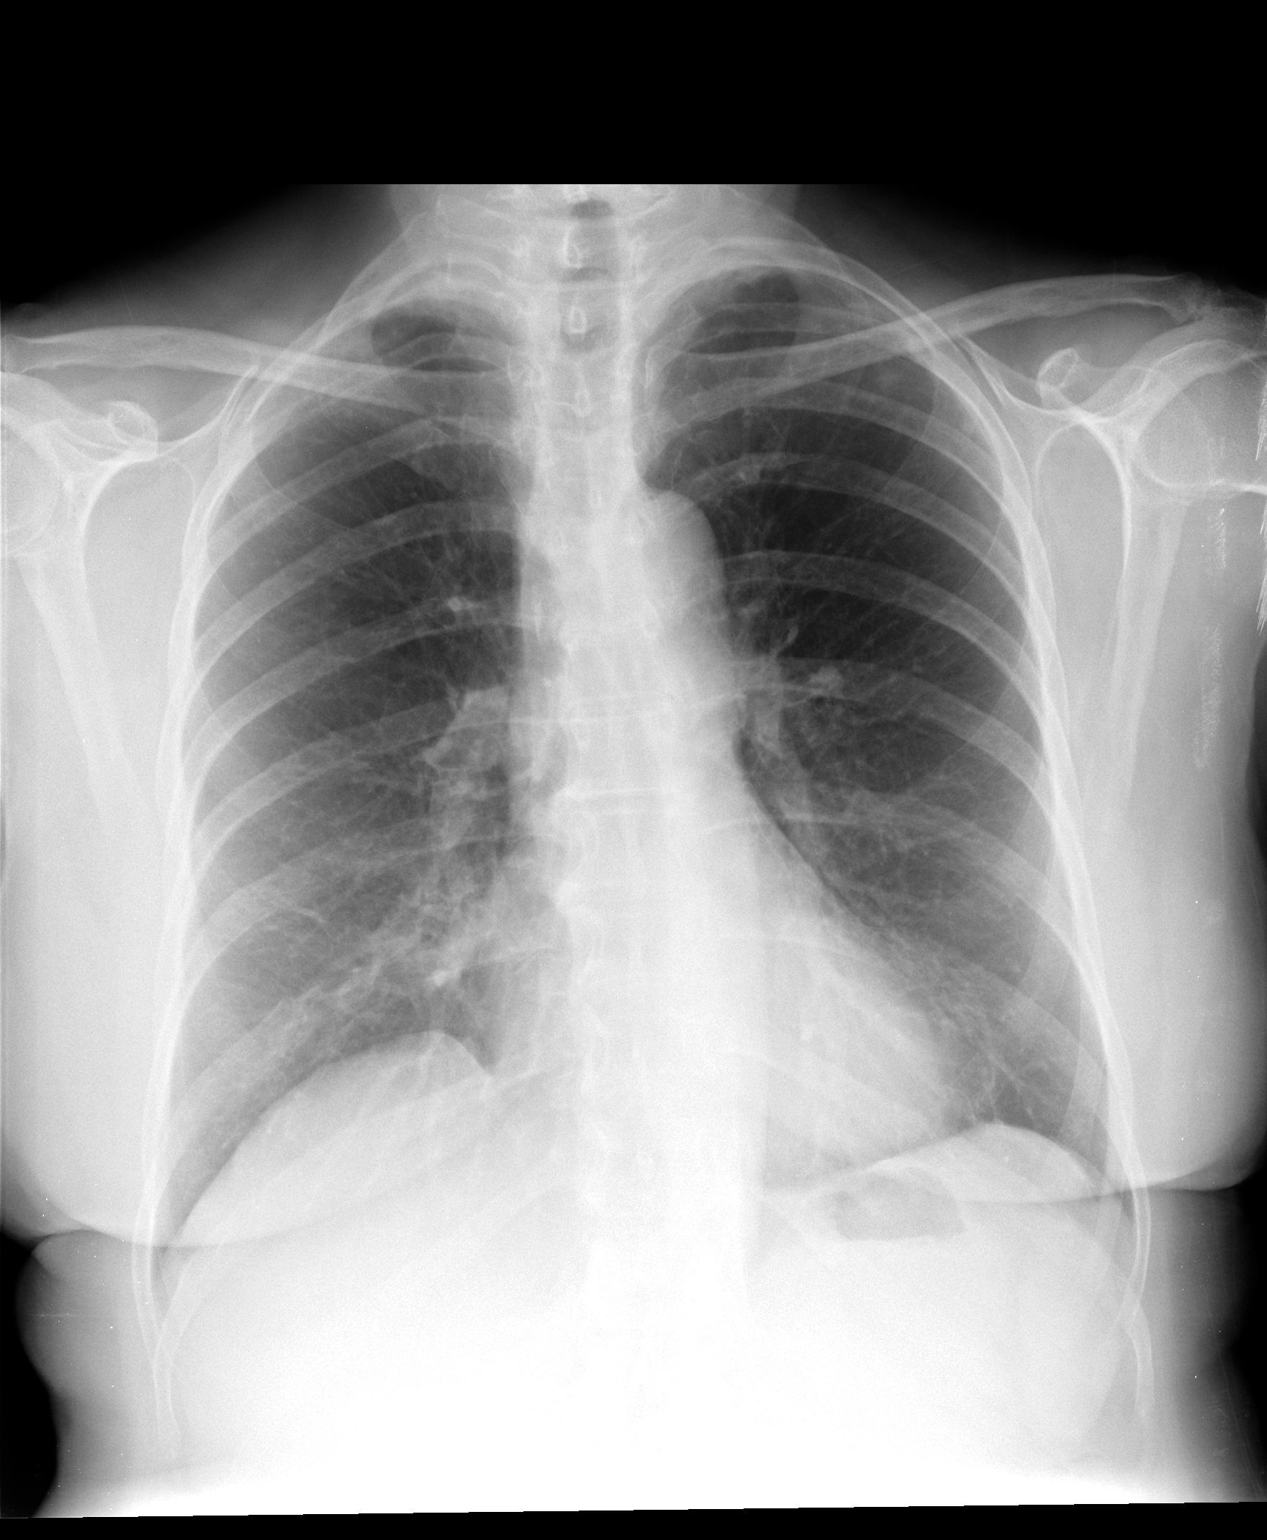

[view not recorded (2 of 2)]
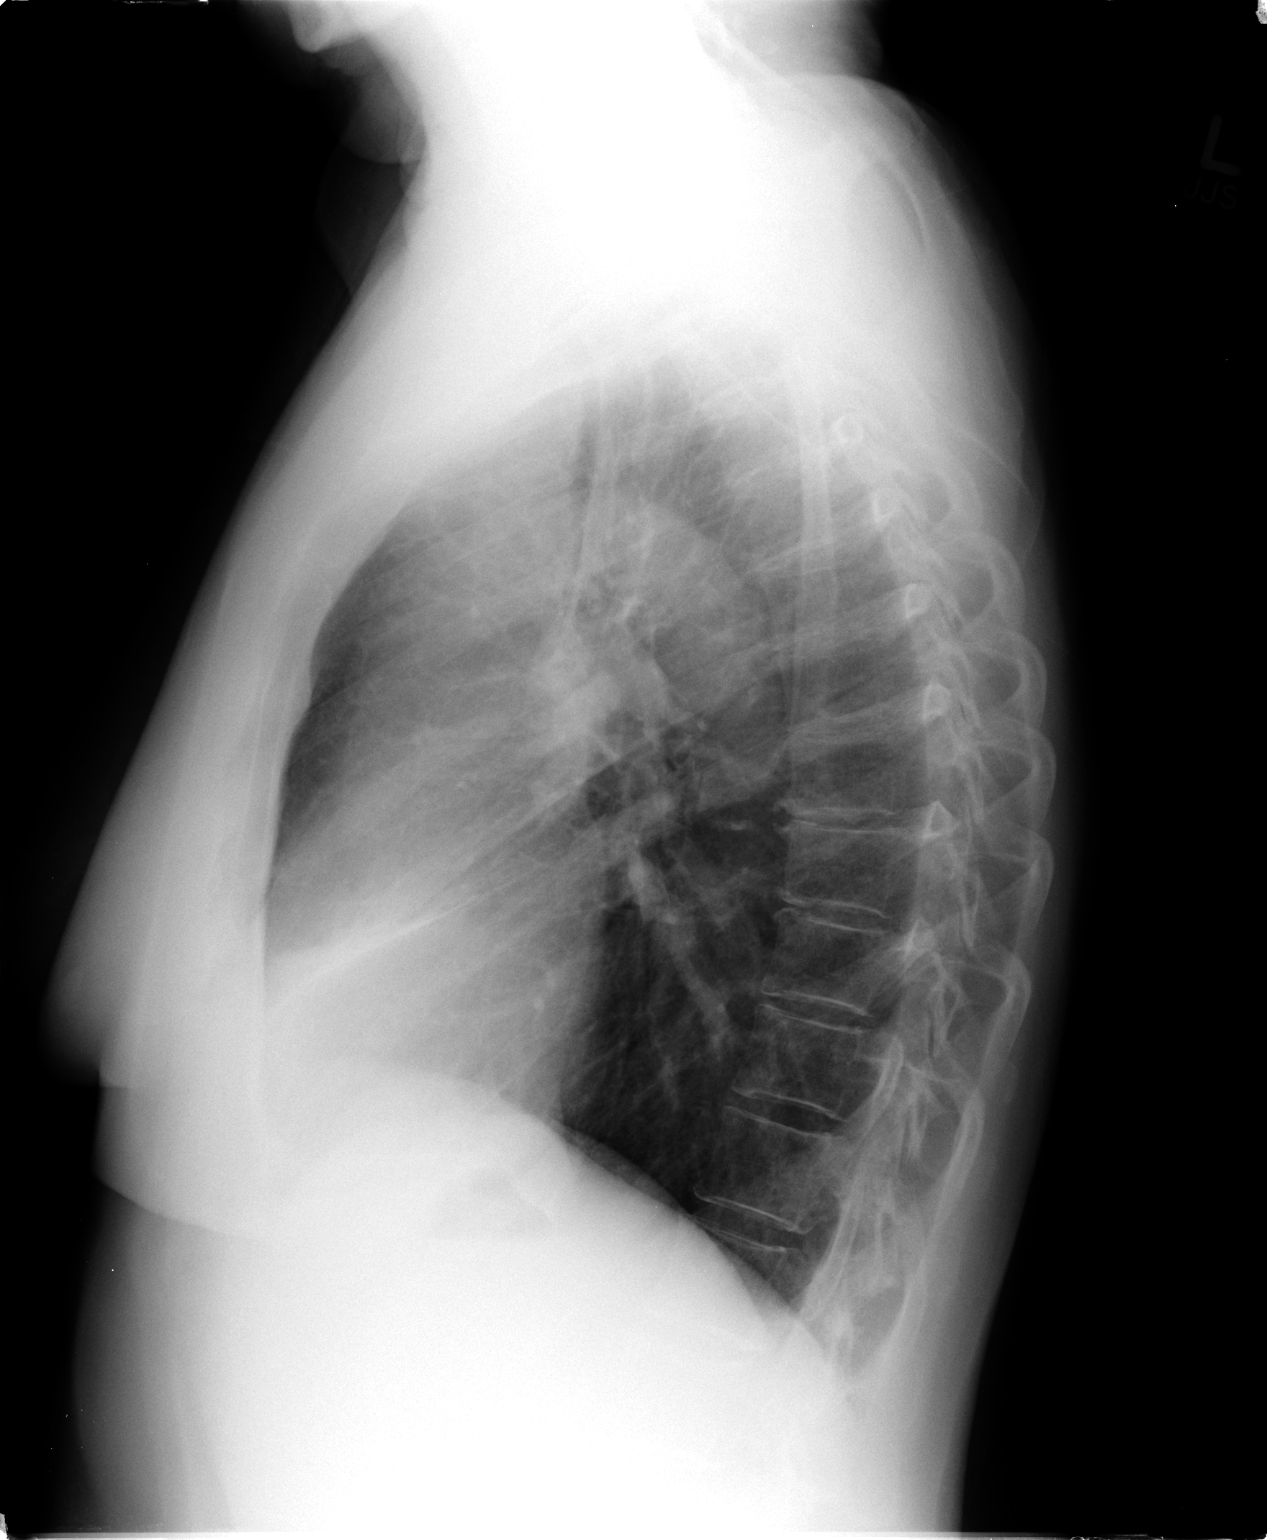

[2 of 2 positions shown; findings below may reference images not displayed]

FINDINGS: Normal heart size, mediastinal contours, and pulmonary vascularity.
Mild peribronchial thickening.
No acute infiltrate, pleural effusion or pneumothorax.
Minimal atelectasis at minor fissure unchanged.
Vague 7 mm left upper lobe nodular density persists.
IMPRESSION: Minimal chronic bronchitic changes.
Persistent vague 7 mm left upper lobe nodular density; further
evaluation by computed tomography recommended to exclude pulmonary
nodule, if not already previously performed.

These results will be called to the ordering clinician or
representative by the Radiologist Assistant, and communication
documented in the PACS Dashboard.

## 2014-08-22 DIAGNOSIS — H35379 Puckering of macula, unspecified eye: Secondary | ICD-10-CM | POA: Insufficient documentation

## 2014-08-22 DIAGNOSIS — D2210 Melanocytic nevi of unspecified eyelid, including canthus: Secondary | ICD-10-CM | POA: Insufficient documentation

## 2014-08-22 DIAGNOSIS — H2510 Age-related nuclear cataract, unspecified eye: Secondary | ICD-10-CM | POA: Insufficient documentation

## 2014-08-22 DIAGNOSIS — H35369 Drusen (degenerative) of macula, unspecified eye: Secondary | ICD-10-CM | POA: Insufficient documentation

## 2014-10-10 ENCOUNTER — Ambulatory Visit (INDEPENDENT_AMBULATORY_CARE_PROVIDER_SITE_OTHER)
Admission: RE | Admit: 2014-10-10 | Discharge: 2014-10-10 | Disposition: A | Discharge status: Home / Self Care | Payer: Medicare Other | Source: Ambulatory Visit | Attending: Internal Medicine | Admitting: Internal Medicine

## 2014-10-10 ENCOUNTER — Encounter: Payer: Self-pay | Admitting: Internal Medicine

## 2014-10-10 ENCOUNTER — Ambulatory Visit (INDEPENDENT_AMBULATORY_CARE_PROVIDER_SITE_OTHER): Payer: Medicare Other | Admitting: Internal Medicine

## 2014-10-10 VITALS — BP 168/90 | HR 62 | Temp 98.1°F | Ht 63.0 in | Wt 155.8 lb

## 2014-10-10 DIAGNOSIS — R030 Elevated blood-pressure reading, without diagnosis of hypertension: Secondary | ICD-10-CM

## 2014-10-10 DIAGNOSIS — Z23 Encounter for immunization: Secondary | ICD-10-CM

## 2014-10-10 DIAGNOSIS — IMO0001 Reserved for inherently not codable concepts without codable children: Secondary | ICD-10-CM

## 2014-10-10 DIAGNOSIS — J479 Bronchiectasis, uncomplicated: Secondary | ICD-10-CM

## 2014-10-10 DIAGNOSIS — R911 Solitary pulmonary nodule: Secondary | ICD-10-CM

## 2014-10-10 NOTE — Assessment & Plan Note (Addendum)
-   LUL by cxr 11/05/2012 in pt with bronciectasis and never smoked so likely benign  -  10/11/13 CT c/w inflammatory nodules assoc with bronchiectasis (see bronchiectasis) > no further CTs rec  cxr looks fine, would not repeat unless new symptoms

## 2014-10-10 NOTE — Assessment & Plan Note (Signed)
Says bp always ok except at doctor's office > f/u primary care

## 2014-10-10 NOTE — Patient Instructions (Addendum)
Next flare of nasty mucus > try doxycycline 100 mg twice daily before eating with glass of water  For cough mucinex dm up to  1200 mg every 12 hours as needed and immediately start Try prilosec 20mg   Take 30-60 min before first meal of the day and Pepcid 20 mg one bedtime until cough is completely gone for at least a week without the need for cough suppression   Please schedule a follow up visit in 6 months but call sooner if needed with cxr on return Add cancel cxr on return

## 2014-10-10 NOTE — Assessment & Plan Note (Signed)
-   CXR 02/2009 min changes in RML vs Lingula     -  Outside CT 04/13/12 c/w rml > lingular  Bronchiectasis    -  PFT's 06/17/2012 FEV1  1.90 (96%) ratio 73 and no change p B2,  DLCO 98%  - CT 10/11/13    1. The suspected pulmonary nodule on chest x-ray represents a small focal area of parenchymal and pleural scarring posteriorly in the left apex. There is no worrisome pulmonary nodule. 2. Multiple small areas of tree-in-bud infiltrates in the right lung. This is nonspecific but has been associated with mycobacterium infections. 3. Chronic bronchiectasis and atelectasis in the right middle lobe and lingula - Prevnar given 04/12/14    Adequate control on present rx, reviewed > no change in rx needed      Each maintenance medication was reviewed in detail including most importantly the difference between maintenance and as needed and under what circumstances the prns are to be used.  Please see instructions for details which were reviewed in writing and the patient given a copy.

## 2014-10-10 NOTE — Progress Notes (Signed)
Subjective:    Patient ID: Latoya Horton, female    DOB: 04/06/1941  MRN: [id number]   Brief patient profile:  28 yowf never smoker with no respiratory problems x for eye allergies dx in [place] with allergy shots x 1990's  Only with occular complaints  until Dec 2012 with ? Viral pneumonia/ cough since >  referred by Dr Latoya Horton 05/04/2012 to pulmonary clinic with CT confirmed bronchiectasis.    History of Present Illness  05/04/2012 1st pulmonary eval cc acute onset cough since Dec 2012 "viral infection" overall 90% better ? If inhaler helped but no longer on it,  ? With exercise if gets supine, does ok walking 2 miles outdoors. No flare of allergy symptoms with this illness. Also new L flank post lat chest only if lie on it. Mucus was yellow now white, was first thing in am then resolved. 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 mucinex 600 mg 1-2 every 12 hours as needed for cough or congestion GERD diet     04/12/2014 f/u ov/Latoya Horton re: copd/bronchiectasis Chief Complaint  Patient presents with  . Follow-up    Pt states breathing improved but then got the flu and bronchitis in Jan. Pts breathing has now improved overall. Denies SOB, cough and CP.   Not limited by breathing from desired activities   Took zpak then second abx and now back to baseline cough  rec Prevnar 13 today Next flare of nasty mucus > try doxycycline 100 mg twice daily before eating with glass of water For cough mucinex dm up to  1200 mg every 12 hours as needed and immediately start Try prilosec 20mg   Take 30-60 min before first meal of the day and Pepcid 20 mg one bedtime until cough is completely gone for at least a week without the need for cough suppression   10/10/2014 f/u ov/Latoya Horton re: bronchiectasis/ LUL nodule Chief Complaint  Patient presents with  . Follow-up    Pt states that her breathing is doing well and denies any  new co's today. Has only used proair x 1 since the last visit.     Denies doe. Still has doxy on hand because no flair since prev ov   No obvious day to day or daytime variabilty or assoc  cp or chest tightness, subjective wheeze overt sinus or hb symptoms. No unusual exp hx or h/o childhood pna/ asthma or knowledge of premature birth.  Sleeping ok without nocturnal  or early am exacerbation  of respiratory  c/o's or need for noct saba. Also denies any obvious fluctuation of symptoms with weather or environmental changes or other aggravating or alleviating factors except as outlined above   Current Medications, Allergies, Complete Past Medical History, Past Surgical History, Family History, and Social History were reviewed in [employer] record.  ROS  The following are not active complaints unless bolded sore throat, dysphagia, dental problems, itching, sneezing,  nasal congestion or excess/ purulent secretions, ear ache,   fever, chills, sweats, unintended wt loss, pleuritic or exertional cp, hemoptysis,  orthopnea pnd or leg swelling, presyncope, palpitations, heartburn, abdominal pain, anorexia, nausea, vomiting, diarrhea  or change in bowel or urinary habits, change in stools or urine, dysuria,hematuria,  rash, arthralgias, visual complaints, headache, numbness weakness or ataxia or problems with walking or coordination,  change in mood/affect or memory.  Objective:  Physical Exam  Pleasant amb wf nad 151 05/04/2012  > 06/17/2012  150 > 07/30/2012  149 > 150 11/05/2012 > 04/14/2013  154 > 10/11/2013  149 > 04/12/2014  152 > 10/10/2014  155  Pleasant amb wf nad with nl vs x for bp "always high just at the doctor's_  HEENT: nl dentition, turbinates, and orophanx. Nl external ear canals without cough reflex   NECK :  without JVD/Nodes/TM/ nl carotid upstrokes bilaterally   LUNGS: no acc muscle use, clear to A and P bilaterally without cough on insp or exp  maneuvers   CV:  RRR  no s3 or murmur or increase in P2, no edema   ABD:  soft and nontender with nl excursion in the supine position. No bruits or organomegaly, bowel sounds nl  MS:  warm without deformities, calf tenderness, cyanosis or clubbing  SKIN: warm and dry without lesions        CXR  10/10/2014 :  No well-defined nodular opacity appreciable. Mild scarring in the apices. Elsewhere lungs are clear. No new opacity appreciable.    10/11/2013 CT chest 1. The suspected pulmonary nodule on chest x-ray represents a small focal area of parenchymal and pleural scarring posteriorly in the left apex. There is no worrisome pulmonary nodule. 2. Multiple small areas of tree-in-bud infiltrates in the right lung. This is nonspecific but has been associated with mycobacterium infections. 3. Chronic bronchiectasis and atelectasis in the right middle lobe and lingula     Assessment & Plan:

## 2014-10-12 ENCOUNTER — Encounter: Payer: Self-pay | Admitting: Internal Medicine

## 2014-12-20 DIAGNOSIS — J9819 Other pulmonary collapse: Secondary | ICD-10-CM | POA: Insufficient documentation

## 2014-12-20 DIAGNOSIS — M75101 Unspecified rotator cuff tear or rupture of right shoulder, not specified as traumatic: Secondary | ICD-10-CM | POA: Insufficient documentation

## 2014-12-20 DIAGNOSIS — J309 Allergic rhinitis, unspecified: Secondary | ICD-10-CM | POA: Insufficient documentation

## 2015-04-10 ENCOUNTER — Ambulatory Visit (INDEPENDENT_AMBULATORY_CARE_PROVIDER_SITE_OTHER): Payer: Medicare Other | Admitting: Internal Medicine

## 2015-04-10 ENCOUNTER — Encounter: Payer: Self-pay | Admitting: Internal Medicine

## 2015-04-10 VITALS — BP 170/92 | HR 66 | Ht 63.0 in | Wt 155.0 lb

## 2015-04-10 DIAGNOSIS — J479 Bronchiectasis, uncomplicated: Secondary | ICD-10-CM | POA: Diagnosis not present

## 2015-04-10 DIAGNOSIS — R030 Elevated blood-pressure reading, without diagnosis of hypertension: Secondary | ICD-10-CM | POA: Diagnosis not present

## 2015-04-10 DIAGNOSIS — IMO0001 Reserved for inherently not codable concepts without codable children: Secondary | ICD-10-CM

## 2015-04-10 MED ORDER — FAMOTIDINE 20 MG PO TABS: ORAL_TABLET | ORAL | Status: DC

## 2015-04-10 MED ORDER — PANTOPRAZOLE SODIUM 40 MG PO TBEC: 40.0000 mg | DELAYED_RELEASE_TABLET | Freq: Every day | ORAL | Status: DC

## 2015-04-10 NOTE — Progress Notes (Signed)
Subjective:    Patient ID: Latoya Horton, female    DOB: 1941/04/29  MRN: [id number]   Brief patient profile:  41 yowf never smoker with no respiratory problems x for eye allergies dx in [place] with allergy shots x 1990's  Only with occular complaints  until Dec 2012 with ? Viral pneumonia/ cough since >  referred by Dr Alfonse Spruce 05/04/2012 to pulmonary clinic with CT confirmed bronchiectasis.    History of Present Illness  05/04/2012 1st pulmonary eval cc acute onset cough since Dec 2012 "viral infection" overall 90% better ? If inhaler helped but no longer on it,  ? With exercise if gets supine, does ok walking 2 miles outdoors. No flare of allergy symptoms with this illness. Also new L flank post lat chest only if lie on it. Mucus was yellow now white, was first thing in am then resolved. 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 mucinex 600 mg 1-2 every 12 hours as needed for cough or congestion GERD diet     04/12/2014 f/u ov/Boyd Buffalo re:  /bronchiectasis Chief Complaint  Patient presents with  . Follow-up    Pt states breathing improved but then got the flu and bronchitis in Jan. Pts breathing has now improved overall. Denies SOB, cough and CP.   Not limited by breathing from desired activities   Took zpak then second abx and now back to baseline cough  rec Prevnar 13 today Next flare of nasty mucus > try doxycycline 100 mg twice daily before eating with glass of water For cough mucinex dm up to  1200 mg every 12 hours as needed and immediately start Try prilosec 20mg   Take 30-60 min before first meal of the day and Pepcid 20 mg one bedtime until cough is completely gone for at least a week without the need for cough suppression       04/10/2015 f/u ov/Gerren Hoffmeier re: bronchiectasis / did not take doxy/ rarely uses  mucinex  Chief Complaint  Patient presents with  . Follow-up    Pt states that she is  having slight increase in SOB since allergy started "but doing pretty good".  She has not used albuterol in about 3 months.   takes tums "right much" Has not used doxy Mucus most prod first thing in am and in certain positions on mat doing aerobics but not purulent or bloody  Not using saba at all  No flutter valve     No obvious day to day or daytime variabilty or assoc  cp or chest tightness, subjective wheeze overt sinus or hb symptoms. No unusual exp hx or h/o childhood pna/ asthma or knowledge of premature birth.  Sleeping ok without nocturnal  or early am exacerbation  of respiratory  c/o's or need for noct saba. Also denies any obvious fluctuation of symptoms with weather or environmental changes or other aggravating or alleviating factors except as outlined above   Current Medications, Allergies, Complete Past Medical History, Past Surgical History, Family History, and Social History were reviewed in [employer] [facility] record.  ROS  The following are not active complaints unless bolded sore throat, dysphagia, dental problems, itching, sneezing,  nasal congestion or excess/ purulent secretions, ear ache,   fever, chills, sweats, unintended wt loss, pleuritic or exertional cp, hemoptysis,  orthopnea pnd or leg swelling, presyncope, palpitations, heartburn, abdominal pain, anorexia, nausea, vomiting, diarrhea  or change in bowel or urinary  habits, change in stools or urine, dysuria,hematuria,  rash, arthralgias, visual complaints, headache, numbness weakness or ataxia or problems with walking or coordination,  change in mood/affect or memory.              Objective:  Physical Exam  Pleasant amb wf nad   151 05/04/2012  > 06/17/2012  150   Wt Readings from Last 3 Encounters:  04/10/15 155 lb (70.308 kg)  10/10/14 155 lb 12.8 oz (70.67 kg)  04/12/14 152 lb 6.4 oz (69.128 kg)    Vital signs reviewed / bp elevation noted   Pleasant amb wf nad  always high just at the  doctor's"  bp ok at home this am  HEENT: nl dentition, turbinates, and orophanx. Nl external ear canals without cough reflex   NECK :  without JVD/Nodes/TM/ nl carotid upstrokes bilaterally   LUNGS: no acc muscle use, clear to A and P bilaterally without cough on insp or exp maneuvers   CV:  RRR  no s3 or murmur or increase in P2, no edema   ABD:  soft and nontender with nl excursion in the supine position. No bruits or organomegaly, bowel sounds nl  MS:  warm without deformities, calf tenderness, cyanosis or clubbing  SKIN: warm and dry without lesions        CXR  10/10/2014 :  No well-defined nodular opacity appreciable. Mild scarring in the apices. Elsewhere lungs are clear. No new opacity appreciable.    10/11/2013 CT chest 1. The suspected pulmonary nodule on chest x-ray represents a small focal area of parenchymal and pleural scarring posteriorly in the left apex. There is no worrisome pulmonary nodule. 2. Multiple small areas of tree-in-bud infiltrates in the right lung. This is nonspecific but has been associated with mycobacterium infections. 3. Chronic bronchiectasis and atelectasis in the right middle lobe and lingula     Assessment & Plan:

## 2015-04-10 NOTE — Patient Instructions (Signed)
Pantoprazole (protonix) 40 mg   Take 30-60 min before first meal of the day and Pepcid ac 20 mg one bedtime until return to office - this is the best way to tell whether stomach acid is contributing to your problem.    GERD (REFLUX)  is an extremely common cause of respiratory symptoms just like yours , many times with no obvious heartburn at all.    It can be treated with medication, but also with lifestyle changes including avoidance of late meals, excessive alcohol, smoking cessation, and avoid fatty foods, chocolate, peppermint, colas, red wine, and acidic juices such as orange juice.  NO MINT OR MENTHOL PRODUCTS SO NO COUGH DROPS  USE SUGARLESS CANDY INSTEAD (Jolley ranchers or Stover's or Life Savers) or even ice chips will also do - the key is to swallow to prevent all throat clearing. NO OIL BASED VITAMINS - use powdered substitutes.   Add flutter valve as needed for cough/congestion   Please schedule a follow up visit in 3 months but call sooner if needed

## 2015-04-11 ENCOUNTER — Encounter: Payer: Self-pay | Admitting: Internal Medicine

## 2015-04-11 NOTE — Assessment & Plan Note (Signed)
rec continue to monitor at home/ f/u primary care prn

## 2015-04-11 NOTE — Assessment & Plan Note (Addendum)
-   CXR 02/2009 min changes in RML vs Lingula     -  Outside CT 04/13/12 c/w rml > lingular  Bronchiectasis    -  PFT's 06/17/2012 FEV1  1.90 (96%) ratio 73 and no change p B2,  DLCO 98%  - CT 10/11/13    1. The suspected pulmonary nodule on chest x-ray represents a small focal area of parenchymal and pleural scarring posteriorly in the left apex. There is no worrisome pulmonary nodule. 2. Multiple small areas of tree-in-bud infiltrates in the right lung. This is nonspecific but has been associated with mycobacterium infections. 3. Chronic bronchiectasis and atelectasis in the right middle lobe and lingula - Prevnar given 04/12/14    I had an extended discussion with the patient reviewing all relevant studies completed to date and  lasting 15 to 20 minutes of a 25 minute visit on the following ongoing concerns:   1) overt HB / need for tums suggests possible cause /effect with bronchiectasis. rec max acid suppression/ diet      2) reviewed need for flutter valve  3)  Each maintenance medication was reviewed in detail including most importantly the difference between maintenance and as needed and under what circumstances the prns are to be used.  Please see instructions for details which were reviewed in writing and the patient given a copy.

## 2015-04-13 ENCOUNTER — Other Ambulatory Visit: Payer: Self-pay | Admitting: *Deleted

## 2015-04-13 MED ORDER — FLUTTER DEVI: Status: DC

## 2015-07-05 ENCOUNTER — Other Ambulatory Visit: Payer: Self-pay | Admitting: Internal Medicine

## 2015-10-17 ENCOUNTER — Encounter: Payer: Self-pay | Admitting: Internal Medicine

## 2015-10-17 ENCOUNTER — Ambulatory Visit (INDEPENDENT_AMBULATORY_CARE_PROVIDER_SITE_OTHER): Payer: Medicare Other | Admitting: Internal Medicine

## 2015-10-17 VITALS — BP 156/92 | HR 68 | Ht 63.0 in | Wt 151.0 lb

## 2015-10-17 DIAGNOSIS — J479 Bronchiectasis, uncomplicated: Secondary | ICD-10-CM | POA: Diagnosis not present

## 2015-10-17 DIAGNOSIS — Z23 Encounter for immunization: Secondary | ICD-10-CM

## 2015-10-17 NOTE — Patient Instructions (Addendum)
Flu shot today   Stay on protonix 40 mg daily and if cough worsens then add back pepcid 20 mg at bedtime   If you are satisfied with your treatment plan,  let your doctor know and he/she can either refill your medications or you can return here when your prescription runs out.     If in any way you are not 100% satisfied,  please tell us.  If 100% better, tell your friends!  Pulmonary follow up is as needed

## 2015-10-17 NOTE — Progress Notes (Signed)
Subjective:    Patient ID: Latoya Horton, female    DOB: 08-12-1941  MRN: [id number]   Brief patient profile:  28 yowf never smoker with no respiratory problems x for eye allergies dx in [place] with allergy shots x 1990's  Only with occular complaints  until Dec 2012 with ? Viral pneumonia/ cough since >  referred by Dr Latoya Horton 05/04/2012 to pulmonary clinic with CT confirmed bronchiectasis.    History of Present Illness  05/04/2012 1st pulmonary eval cc acute onset cough since Dec 2012 "viral infection" overall 90% better ? If inhaler helped but no longer on it,  ? With exercise if gets supine, does ok walking 2 miles outdoors. No flare of allergy symptoms with this illness. Also new L flank post lat chest only if lie on it. Mucus was yellow now white, was first thing in am then resolved. 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 mucinex 600 mg 1-2 every 12 hours as needed for cough or congestion GERD diet     04/12/2014 f/u ov/Latoya Horton re:  /bronchiectasis Chief Complaint  Patient presents with  . Follow-up    Pt states breathing improved but then got the flu and bronchitis in Jan. Pts breathing has now improved overall. Denies SOB, cough and CP.   Not limited by breathing from desired activities   Took zpak then second abx and now back to baseline cough  rec Prevnar 13 today Next flare of nasty mucus > try doxycycline 100 mg twice daily before eating with glass of water For cough mucinex dm up to  1200 mg every 12 hours as needed and immediately start Try prilosec 20mg   Take 30-60 min before first meal of the day and Pepcid 20 mg one bedtime until cough is completely gone for at least a week without the need for cough suppression       04/10/2015 f/u ov/Latoya Horton re: bronchiectasis / did not take doxy/ rarely uses  mucinex  Chief Complaint  Patient presents with  . Follow-up    Pt states that she is  having slight increase in SOB since allergy started "but doing pretty good".  She has not used albuterol in about 3 months.   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 Follow-up    Pt states her cough has improved. Breathing is doing well. She has not needed albuterol.       Not limited by breathing from desired activities   - cough much better since rx for gerd started    No obvious day to day or daytime variabilty or assoc  cp or chest tightness, subjective wheeze overt sinus or hb symptoms. No unusual exp hx or h/o childhood pna/ asthma or knowledge of premature birth.  Sleeping ok without nocturnal  or early am exacerbation  of respiratory  c/o's or need for noct saba. Also denies any obvious fluctuation of symptoms with weather  or environmental changes or other aggravating or alleviating factors except as outlined above   Current Medications, Allergies, Complete Past Medical History, Past Surgical History, Family History, and Social History were reviewed in [employer] [employer] record.  ROS  The following are not active complaints unless bolded sore throat, dysphagia, dental problems, itching, sneezing,  nasal congestion or excess/ purulent secretions, ear ache,   fever, chills, sweats, unintended wt loss, pleuritic or exertional cp, hemoptysis,  orthopnea pnd or leg swelling, presyncope, palpitations, heartburn, abdominal pain, anorexia, nausea, vomiting, diarrhea  or change in bowel or urinary habits, change in stools or urine,  dysuria,hematuria,  rash, arthralgias, visual complaints, headache, numbness weakness or ataxia or problems with walking or coordination,  change in mood/affect or memory.              Objective:  Physical Exam  Pleasant amb wf nad   151 05/04/2012  > 06/17/2012  150 > 10/17/2015   151   Wt Readings from Last 3 Encounters:  04/10/15 155 lb (70.308 kg)  10/10/14 155 lb 12.8 oz (70.67 kg)  04/12/14 152 lb 6.4 oz (69.128 kg)    Vital signs reviewed / bp elevation noted   Pleasant amb wf nad  always high just at the doctor's- reports   bp ok at home this am  HEENT: nl dentition, turbinates, and orophanx. Nl external ear canals without cough reflex   NECK :  without JVD/Nodes/TM/ nl carotid upstrokes bilaterally   LUNGS: no acc muscle use, clear to A and P bilaterally without cough on insp or exp maneuvers   CV:  RRR  no s3 or murmur or increase in P2, no edema   ABD:  soft and nontender with nl excursion in the supine position. No bruits or organomegaly, bowel sounds nl  MS:  warm without deformities, calf tenderness, cyanosis or clubbing  SKIN: warm and dry without lesions                Assessment & Plan:   Outpatient Encounter Prescriptions as of 10/17/2015  Medication Sig  . acetaminophen (TYLENOL) 325 MG tablet Take 325 mg by mouth every 6 (six) hours as needed for pain or fever.  . Calcium Carbonate-Vitamin D (CALCIUM 600 + D PO) Take 1 tablet by mouth 2 (two) times daily.  . Cholecalciferol (D3-1000) 1000 UNITS capsule Take 2,000 Units by mouth daily.  Marland Kitchen dextromethorphan-guaiFENesin (MUCINEX DM) 30-600 MG per 12 hr tablet Take 1 tablet by mouth every 12 (twelve) hours as needed.  . Multiple Vitamins-Minerals (CENTRUM SILVER PO) Take 1 tablet by mouth daily.  . pantoprazole (PROTONIX) 40 MG tablet TAKE 1 TABLET (40 MG TOTAL) BY MOUTH DAILY. TAKE 30-60 MIN BEFORE FIRST MEAL OF THE DAY  . PROAIR HFA 108 (90 BASE) MCG/ACT inhaler Inhale into the lungs as needed.  Marland Kitchen  Respiratory Therapy Supplies (FLUTTER) DEVI Use as directed  . UNABLE TO FIND Med Name: Allergy shots as directed  . doxycycline (VIBRA-TABS) 100 MG tablet One twice daily with glass of water before eating when mucus turns nasty (Patient not taking: Reported on 04/10/2015)  . [DISCONTINUED] famotidine (PEPCID) 20 MG tablet One at bedtime   No facility-administered encounter medications on file as of 10/17/2015.

## 2015-10-18 NOTE — Assessment & Plan Note (Addendum)
-   CXR 02/2009 min changes in RML vs Lingula     -  Outside CT 04/13/12 c/w rml > lingular  Bronchiectasis    -  PFT's 06/17/2012 FEV1  1.90 (96%) ratio 73 and no change p B2,  DLCO 98%  - CT 10/11/13    1. The suspected pulmonary nodule on chest x-ray represents a small focal area of parenchymal and pleural scarring posteriorly in the left apex. There is no worrisome pulmonary nodule. 2. Multiple small areas of tree-in-bud infiltrates in the right lung. This is nonspecific but has been associated with mycobacterium infections. 3. Chronic bronchiectasis and atelectasis in the right middle lobe and lingula - Prevnar given 04/12/14   She is very well compensated at present despite extensive disease on CT scan no longer limited by dyspnea and really having no cough at all.  One major issue is the extent to which reflux was playing a role in both her cough and her bronchiectasis but is explained to patient this is sometimes a chicken and eggs analysis that is difficult to sort out.  Discussed the recent press about ppi's in the context of a statistically significant (but questionably clinically relevant) increase in CRI in pts on ppi vs h2's > bottom line is the lowest dose of ppi that controls   gerd is the right dose and if that dose is zero that's fine esp since h2's are cheaper> defer longterm gerd rx to primar care with gi input prn     I had an extended summary final discussion with the patient reviewing all relevant studies completed to date and  lasting 15 to 20 minutes of a 25 minute visit    Each maintenance medication was reviewed in detail including most importantly the difference between maintenance and prns and under what circumstances the prns are to be triggered using an action plan format that is not reflected in the computer generated alphabetically organized AVS.    Please see instructions for details which were reviewed in writing and the patient given a copy highlighting the  part that I personally wrote and discussed at today's ov.

## 2015-10-20 ENCOUNTER — Encounter: Payer: Self-pay | Admitting: Internal Medicine

## 2015-10-20 MED ORDER — PANTOPRAZOLE SODIUM 40 MG PO TBEC: DELAYED_RELEASE_TABLET | ORAL | Status: DC

## 2015-11-12 IMAGING — CR DG CHEST 2V
2 series · 2 of 2 positions shown · non-contrast
Comparison: Chest radiograph April 14, 2013 and chest CT October 11, 2013

CLINICAL DATA: Bilateral apical scarring ; prior nodular opacity
left upper lobe

EXAM:
CHEST  2 VIEW

[view not recorded (1 of 2)]
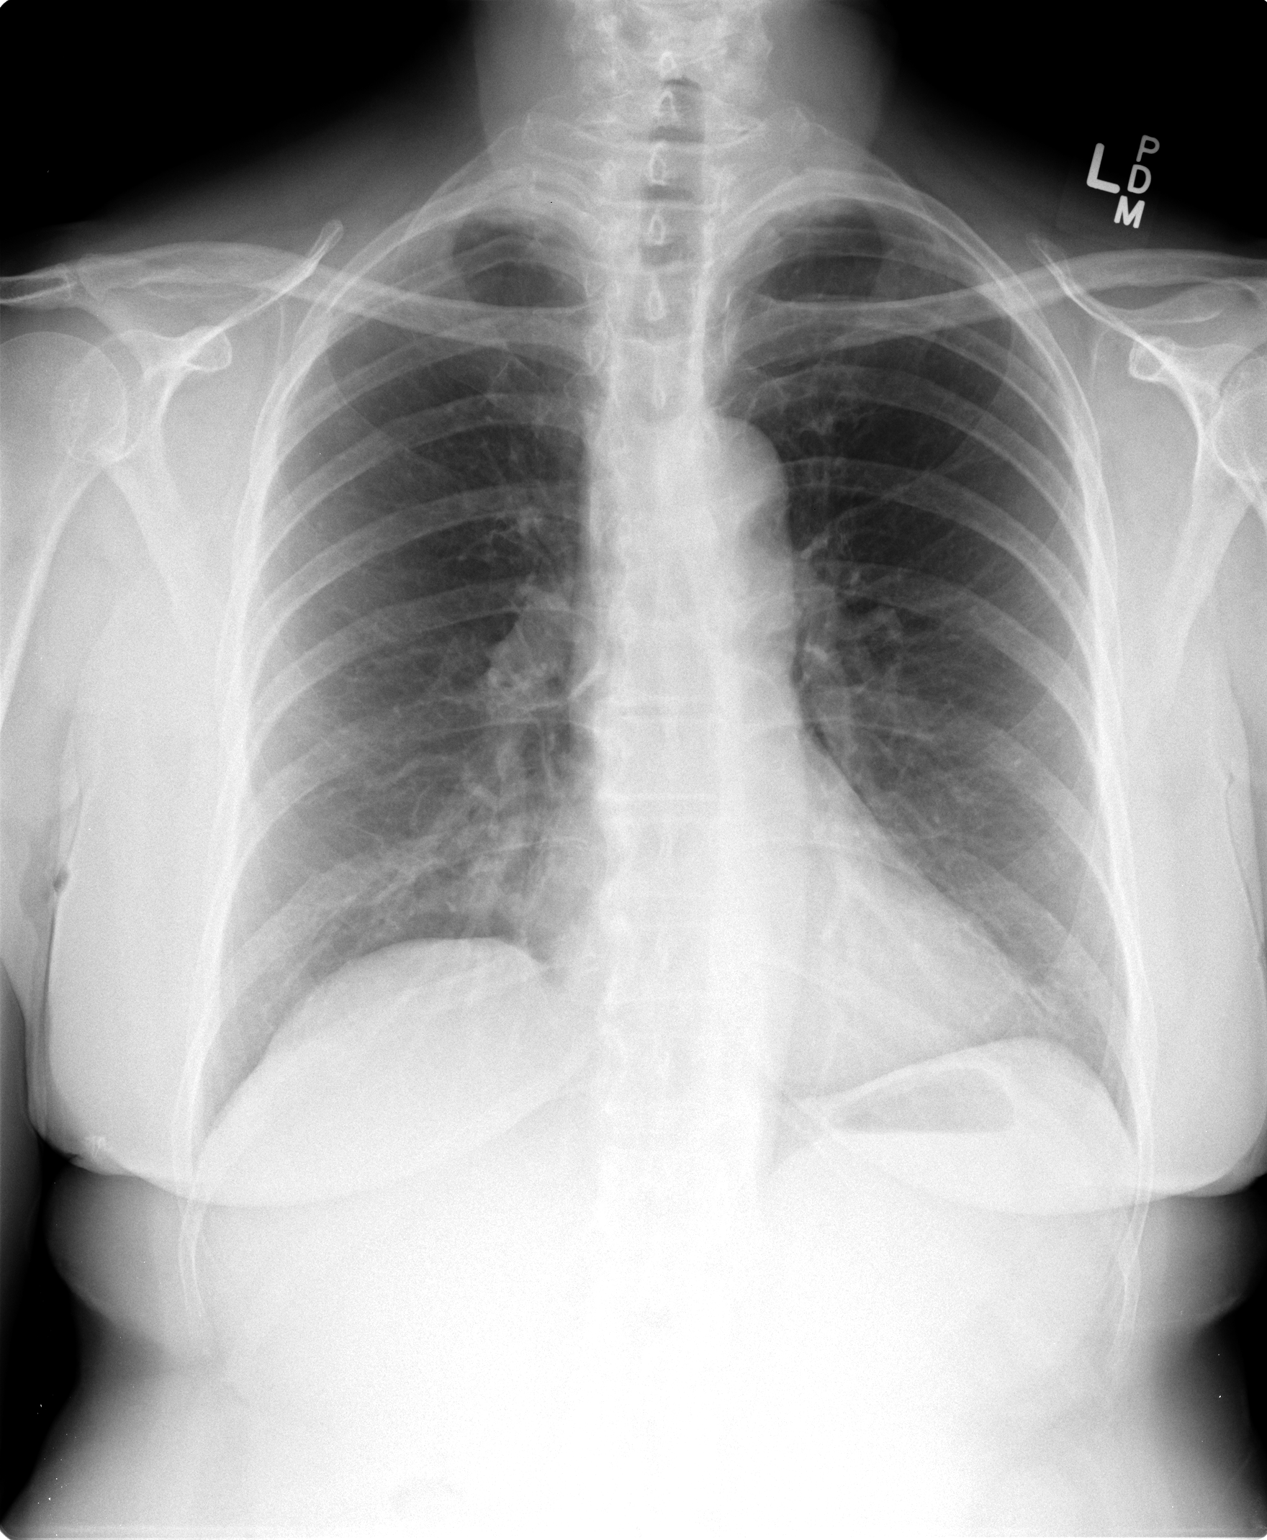

[view not recorded (2 of 2)]
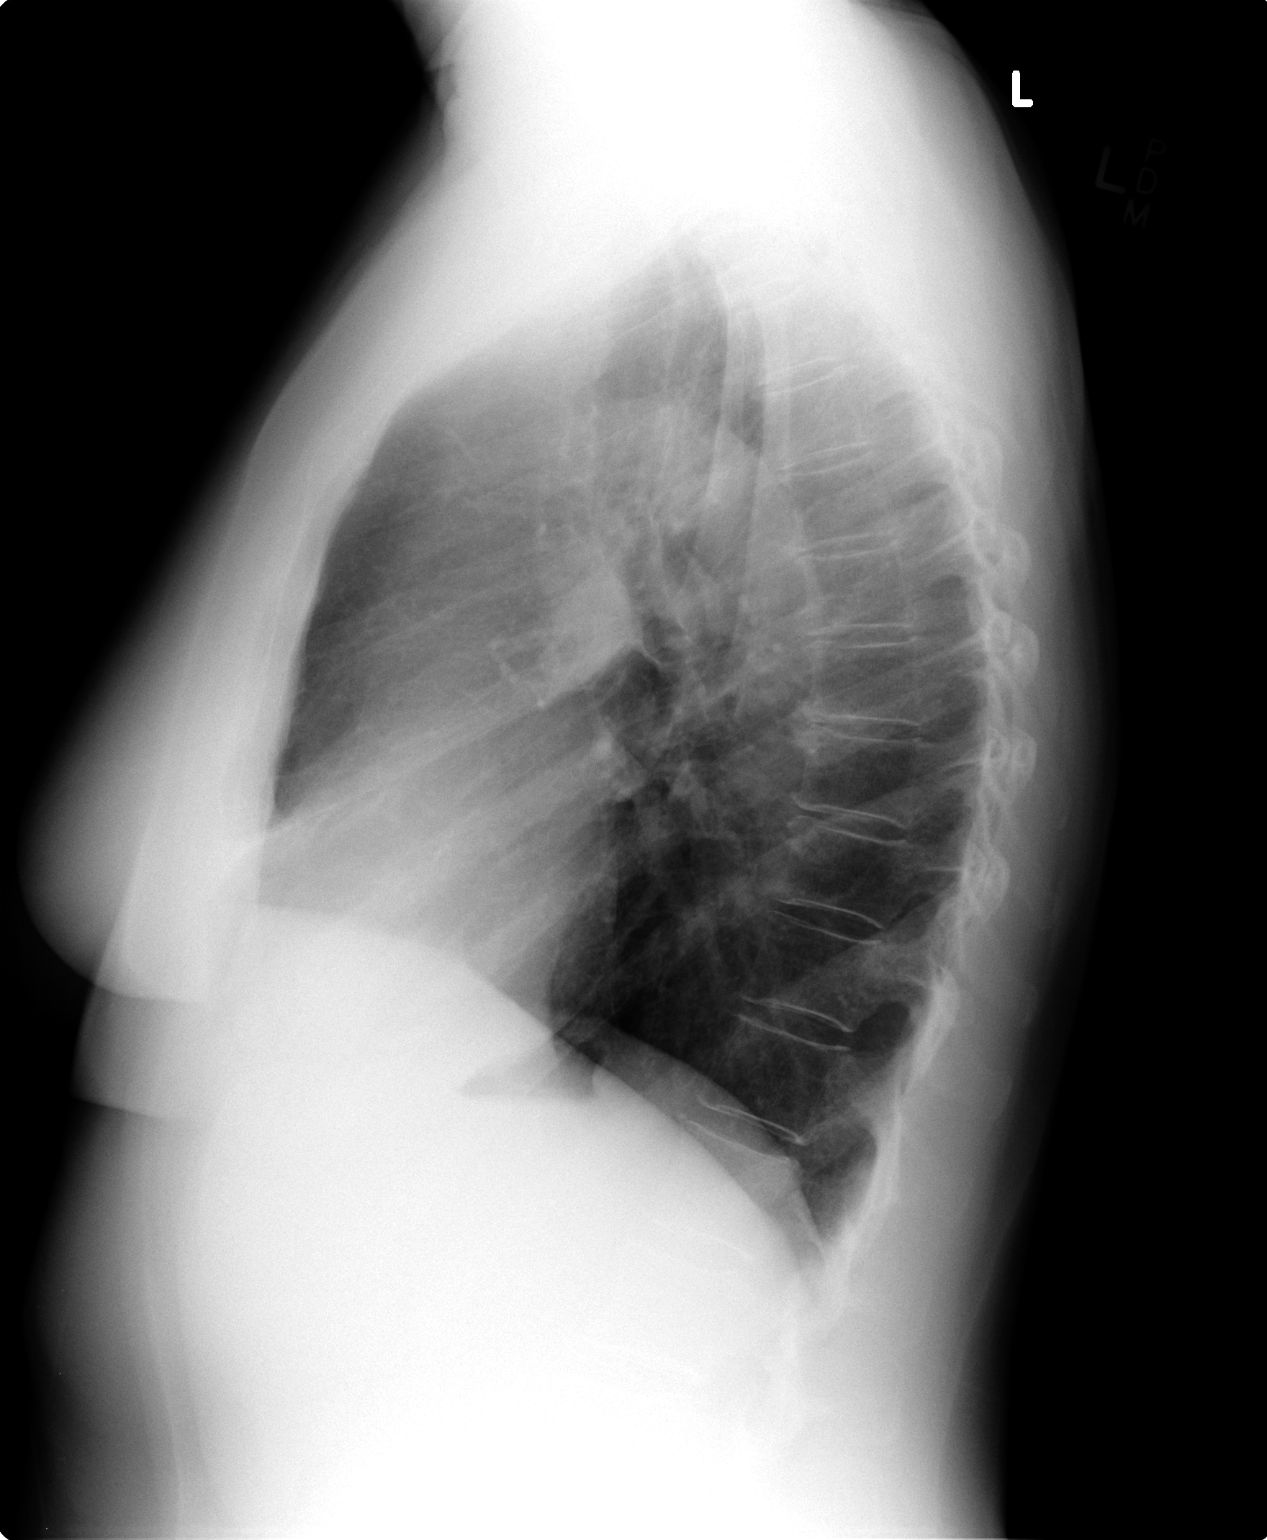

[2 of 2 positions shown; findings below may reference images not displayed]

FINDINGS: The previously noted nodular opacity in the left upper lobe is not
well seen at this time. Mild apical pleural scarring is present on
this study and appears essentially stable. There is no new opacity.
No edema or consolidation. Heart size and pulmonary vascularity are
normal. No adenopathy. No bone lesions.
IMPRESSION: No well-defined nodular opacity appreciable. Mild scarring in the
apices. Elsewhere lungs are clear. No new opacity appreciable.

## 2016-03-31 DIAGNOSIS — R739 Hyperglycemia, unspecified: Secondary | ICD-10-CM | POA: Insufficient documentation

## 2016-03-31 DIAGNOSIS — E782 Mixed hyperlipidemia: Secondary | ICD-10-CM | POA: Insufficient documentation

## 2016-04-10 DIAGNOSIS — I10 Essential (primary) hypertension: Secondary | ICD-10-CM | POA: Insufficient documentation

## 2016-10-23 ENCOUNTER — Ambulatory Visit: Payer: Medicare Other

## 2016-10-23 ENCOUNTER — Ambulatory Visit (INDEPENDENT_AMBULATORY_CARE_PROVIDER_SITE_OTHER): Payer: Medicare Other

## 2016-10-23 DIAGNOSIS — Z23 Encounter for immunization: Secondary | ICD-10-CM | POA: Diagnosis not present

## 2018-06-09 DIAGNOSIS — I10 Essential (primary) hypertension: Secondary | ICD-10-CM | POA: Insufficient documentation

## 2018-11-04 ENCOUNTER — Ambulatory Visit (INDEPENDENT_AMBULATORY_CARE_PROVIDER_SITE_OTHER): Payer: Medicare Other

## 2018-11-04 ENCOUNTER — Ambulatory Visit (INDEPENDENT_AMBULATORY_CARE_PROVIDER_SITE_OTHER): Payer: Medicare Other | Admitting: Orthopaedic Surgery

## 2018-11-04 ENCOUNTER — Encounter (INDEPENDENT_AMBULATORY_CARE_PROVIDER_SITE_OTHER): Payer: Self-pay | Admitting: Orthopaedic Surgery

## 2018-11-04 ENCOUNTER — Ambulatory Visit (INDEPENDENT_AMBULATORY_CARE_PROVIDER_SITE_OTHER): Payer: Self-pay | Admitting: Orthopaedic Surgery

## 2018-11-04 VITALS — BP 137/74 | HR 72 | Ht 63.0 in | Wt 154.0 lb

## 2018-11-04 DIAGNOSIS — M25512 Pain in left shoulder: Secondary | ICD-10-CM | POA: Diagnosis not present

## 2018-11-04 MED ORDER — LIDOCAINE HCL 2 % IJ SOLN
2.0000 mL | INTRAMUSCULAR | Status: AC | PRN
  Administered 2018-11-04: 2 mL

## 2018-11-04 MED ORDER — METHYLPREDNISOLONE ACETATE 40 MG/ML IJ SUSP
80.0000 mg | INTRAMUSCULAR | Status: AC | PRN
  Administered 2018-11-04: 80 mg

## 2018-11-04 MED ORDER — BUPIVACAINE HCL 0.5 % IJ SOLN
2.0000 mL | INTRAMUSCULAR | Status: AC | PRN
  Administered 2018-11-04: 2 mL via INTRA_ARTICULAR

## 2018-11-04 NOTE — Progress Notes (Signed)
Office Visit Note   Patient: Latoya Horton           Date of Birth: 15-Aug-1941           MRN: [id number] Visit Date: 11/04/2018              Requested by: Bonita Quin, MD No address on file PCP: Bonita Quin, MD   Assessment & Plan: Visit Diagnoses:  1. Acute pain of left shoulder     Plan: Impingement syndrome left shoulder.  We will try subacromial cortisone injection and monitor response.  Could have a small rotator cuff tear consider MRI scan if no improvement  Follow-Up Instructions: Return if symptoms worsen or fail to improve.   Orders:  Orders Placed This Encounter  Procedures  . Large Joint Inj: L subacromial bursa  . XR Shoulder Left   No orders of the defined types were placed in this encounter.     Procedures: Large Joint Inj: L subacromial bursa on 11/04/2018 12:44 PM Indications: pain and diagnostic evaluation Details: 25 G 1.5 in needle, anterior approach  Arthrogram: No  Medications: 2 mL lidocaine 2 %; 2 mL bupivacaine 0.5 %; 80 mg methylPREDNISolone acetate 40 MG/ML Consent was given by the patient. Immediately prior to procedure a time out was called to verify the correct patient, procedure, equipment, support staff and site/side marked as required. Patient was prepped and draped in the usual sterile fashion.       Clinical Data: No additional findings.   Subjective: Chief Complaint  Patient presents with  . New Patient (Initial Visit)    L SHOULDER PAIN NOTICED SYMPTOMS AFTER STAINING DECK 2 WEEKS AGO, PAINFUL TO RAISE ARM OVER HEAD   Latoya Horton to the office for evaluation of left shoulder pain.  Several weeks ago she was staining her deck with repetitive motion of her left upper extremity.  Several days after that activity she began to experience significant achiness and soreness of her left shoulder.  She is "somewhat better" with time and medicines but is concerned because she is had evidence of a rotator cuff tear on the  right side that she is been treating nonoperatively.  Not had any neck pain.  Denies any numbness or tingling.  She has difficulty with overhead activity and sleeping on that side.  HPI  Review of Systems  Constitutional: Negative for fatigue and fever.  HENT: Negative for ear pain.   Eyes: Negative for pain.  Respiratory: Negative for cough and shortness of breath.   Cardiovascular: Negative for leg swelling.  Gastrointestinal: Negative for constipation and diarrhea.  Genitourinary: Negative for difficulty urinating.  Musculoskeletal: Negative for back pain and neck pain.  Skin: Negative for rash.  Allergic/Immunologic: Negative for food allergies.  Neurological: Positive for weakness and numbness.  Hematological: Does not bruise/bleed easily.  Psychiatric/Behavioral: Positive for sleep disturbance.     Objective: Vital Signs: BP 137/74 (BP Location: Left Arm, Patient Position: Sitting, Cuff Size: Normal)   Pulse 72   Ht 5\' 3"  (1.6 m)   Wt 154 lb (69.9 kg)   BMI 27.28 kg/m   Physical Exam  Constitutional: She is oriented to person, place, and time. She appears well-developed and well-nourished.  HENT:  Mouth/Throat: Oropharynx is clear and moist.  Eyes: Pupils are equal, round, and reactive to light. EOM are normal.  Pulmonary/Chest: Effort normal.  Neurological: She is alert and oriented to person, place, and time.  Skin: Skin is warm and dry.  Psychiatric:  She has a normal mood and affect. Her behavior is normal.    Ortho Exam alert and oriented x3.  Comfortable sitting.  Full overhead motion of right shoulder with a slightly circuitous arc.  Some pain with abduction in the anterior and lateral subacromial region.  Skin intact.  Biceps intact.  Minimally positive impingement on external rotation minimally positive empty can test.  Good grip and good release.  No grating or crepitation Specialty Comments:  No specialty comments available.  Imaging: Xr Shoulder  Left  Result Date: 11/04/2018 Terms of the left shoulder obtained in several projections.  There are degenerative changes at the acromioclavicular joint with sclerosis on both sides of the joint and inferiorly directed spurring.  Very small inferior humeral heads osteophyte.  Normal space between the humeral head and the acromium.  No ectopic calcification.  Humeral head is centered about the glenoid    PMFS History: Patient Active Problem List   Diagnosis Date Noted  . White coat hypertension 10/10/2014  . Cough 11/05/2012  . Pulmonary nodule 11/05/2012  . Chest pain 06/17/2012  . Bronchiectasis ([place]) 05/04/2012   Past Medical History:  Diagnosis Date  . GERD (gastroesophageal reflux disease)   . IBS (irritable bowel syndrome)   . Multiple allergies   . Scar tissue    of the lungs    Family History  Problem Relation Age of Onset  . Heart disease Father 25       Died age 85  . Heart disease Brother 67       CHF  . Breast cancer Paternal Aunt   . Ovarian cancer Maternal Aunt   . Diabetes Brother     Past Surgical History:  Procedure Laterality Date  . TUBAL LIGATION  2004   Social History   Occupational History  . Occupation: Retired    Comment: Scientist, water quality  Tobacco Use  . Smoking status: Never Smoker  . Smokeless tobacco: Never Used  Substance and Sexual Activity  . Alcohol use: No  . Drug use: No  . Sexual activity: Not on file

## 2019-02-24 DIAGNOSIS — R42 Dizziness and giddiness: Secondary | ICD-10-CM | POA: Insufficient documentation

## 2019-05-06 DIAGNOSIS — H35342 Macular cyst, hole, or pseudohole, left eye: Secondary | ICD-10-CM | POA: Insufficient documentation

## 2019-08-10 ENCOUNTER — Other Ambulatory Visit: Payer: Self-pay

## 2019-08-10 ENCOUNTER — Encounter: Payer: Self-pay | Admitting: Orthopaedic Surgery

## 2019-08-10 ENCOUNTER — Ambulatory Visit (INDEPENDENT_AMBULATORY_CARE_PROVIDER_SITE_OTHER): Payer: Medicare Other | Admitting: Orthopaedic Surgery

## 2019-08-10 ENCOUNTER — Ambulatory Visit (INDEPENDENT_AMBULATORY_CARE_PROVIDER_SITE_OTHER): Payer: Medicare Other

## 2019-08-10 VITALS — BP 179/102 | HR 80 | Ht 63.0 in | Wt 153.0 lb

## 2019-08-10 DIAGNOSIS — M5441 Lumbago with sciatica, right side: Secondary | ICD-10-CM | POA: Diagnosis not present

## 2019-08-10 DIAGNOSIS — G8929 Other chronic pain: Secondary | ICD-10-CM | POA: Diagnosis not present

## 2019-08-10 DIAGNOSIS — M545 Low back pain, unspecified: Secondary | ICD-10-CM | POA: Insufficient documentation

## 2019-08-10 MED ORDER — METHYLPREDNISOLONE ACETATE 40 MG/ML IJ SUSP
40.0000 mg | INTRAMUSCULAR | Status: AC | PRN
Start: 2019-08-10 — End: 2019-08-10
  Administered 2019-08-10: 17:00:00 40 mg via INTRAMUSCULAR

## 2019-08-10 MED ORDER — BUPIVACAINE HCL 0.5 % IJ SOLN
2.0000 mL | INTRAMUSCULAR | Status: AC | PRN
  Administered 2019-08-10: 2 mL

## 2019-08-10 MED ORDER — LIDOCAINE HCL 1 % IJ SOLN
2.0000 mL | INTRAMUSCULAR | Status: AC | PRN
  Administered 2019-08-10: 2 mL

## 2019-08-10 NOTE — Progress Notes (Signed)
Office Visit Note   Patient: Latoya Horton           Date of Birth: February 08, 1941           MRN: [id number] Visit Date: 08/10/2019              Requested by: Bonita Quin, MD No address on file PCP: Ollen Bowl, MD   Assessment & Plan: Visit Diagnoses:  1. Chronic right-sided low back pain with right-sided sciatica     Plan: 43-month history of right-sided paralumbar pain after working out at the gym.  Some referred pain in her right leg posteriorly particularly in the morning.  Has an area of trigger point tenderness I will inject with cortisone and then have her follow-up.  This could originate from her lumbar spine  Follow-Up Instructions: Return if symptoms worsen or fail to improve.   Orders:  Orders Placed This Encounter  Procedures  . Trigger Point Inj  . XR Lumbar Spine 2-3 Views   No orders of the defined types were placed in this encounter.     Procedures: Trigger Point Inj  Date/Time: 08/10/2019 4:45 PM Performed by: Garald Balding, MD Authorized by: Garald Balding, MD   Consent Given by:  Patient Site marked: the procedure site was marked   Indications:  Pain Total # of Trigger Points:  1 Location: back   Approach:  Dorsal Medications #1:  2 mL lidocaine 1 %; 2 mL bupivacaine 0.5 %; 40 mg methylPREDNISolone acetate 40 MG/ML     Clinical Data: No additional findings.   Subjective: Chief Complaint  Patient presents with  . Lower Back - Pain  Patient presents today for right lower back pain. She was at the gym two months ago and was doing exercises. She felt pain in her back then, but continued on. She has been having pain in her lower back that radiates down her leg. The pain is worse in the morning. She has some numbness, weakness, and tingling down her leg. She takes Ibuprofen occasionally.   HPI  Review of Systems   Objective: Vital Signs: BP (!) 179/102   Pulse 80   Ht 5\' 3"  (1.6 m)   Wt 153 lb (69.4 kg)   BMI 27.10  kg/m   Physical Exam Constitutional:      Appearance: She is well-developed.  Eyes:     Pupils: Pupils are equal, round, and reactive to light.  Pulmonary:     Effort: Pulmonary effort is normal.  Skin:    General: Skin is warm and dry.  Neurological:     Mental Status: She is alert and oriented to person, place, and time.  Psychiatric:        Behavior: Behavior normal.     Ortho Exam awake alert and oriented x3.  Comfortable sitting straight leg raise negative bilaterally.  There is an area of tenderness directly in the superior buttocks laterally.  No masses.  No percussible tenderness of lumbar spine.  No pain range of motion of right hip no distal edema.  Motor exam intact.  Walks without a limp  Specialty Comments:  No specialty comments available.  Imaging: Xr Lumbar Spine 2-3 Views  Result Date: 08/10/2019 Films of the lumbar spine were obtained in the AP lateral projection.  There are degenerative changes at the facet joints particularly at L4-5 and L5-S1.  No listhesis.  Decreased bone density.  A degenerative right lumbar scoliosis.  No acute changes  PMFS History: Patient Active Problem List   Diagnosis Date Noted  . Low back pain 08/10/2019  . White coat hypertension 10/10/2014  . Cough 11/05/2012  . Pulmonary nodule 11/05/2012  . Chest pain 06/17/2012  . Bronchiectasis ([place]) 05/04/2012   Past Medical History:  Diagnosis Date  . GERD (gastroesophageal reflux disease)   . IBS (irritable bowel syndrome)   . Multiple allergies   . Scar tissue    of the lungs    Family History  Problem Relation Age of Onset  . Heart disease Father 29       Died age 90  . Heart disease Brother 80       CHF  . Breast cancer Paternal Aunt   . Ovarian cancer Maternal Aunt   . Diabetes Brother     Past Surgical History:  Procedure Laterality Date  . TUBAL LIGATION  2004   Social History   Occupational History  . Occupation: Retired    Comment: Lawyer  Tobacco Use  . Smoking status: Never Smoker  . Smokeless tobacco: Never Used  Substance and Sexual Activity  . Alcohol use: No  . Drug use: No  . Sexual activity: Not on file

## 2019-08-11 ENCOUNTER — Telehealth: Payer: Self-pay | Admitting: Orthopaedic Surgery

## 2019-08-11 NOTE — Telephone Encounter (Signed)
Please advise 

## 2019-08-11 NOTE — Telephone Encounter (Signed)
Patient left a voicemail requesting a return call regarding the results of her back x-ray she had at her appointment.

## 2019-08-11 NOTE — Telephone Encounter (Signed)
called

## 2019-09-22 ENCOUNTER — Other Ambulatory Visit: Payer: Self-pay

## 2019-09-22 ENCOUNTER — Encounter: Payer: Self-pay | Admitting: Orthopaedic Surgery

## 2019-09-22 ENCOUNTER — Ambulatory Visit (INDEPENDENT_AMBULATORY_CARE_PROVIDER_SITE_OTHER): Payer: Medicare Other | Admitting: Orthopaedic Surgery

## 2019-09-22 VITALS — BP 160/94 | HR 66 | Ht 63.0 in | Wt 150.0 lb

## 2019-09-22 DIAGNOSIS — M5441 Lumbago with sciatica, right side: Secondary | ICD-10-CM

## 2019-09-22 DIAGNOSIS — G8929 Other chronic pain: Secondary | ICD-10-CM | POA: Diagnosis not present

## 2019-09-22 NOTE — Progress Notes (Signed)
Office Visit Note   Patient: Latoya Horton           Date of Birth: 1941/02/23           MRN: [id number] Visit Date: 09/22/2019              Requested by: Ollen Bowl, MD [address] [place] [address] PCP: Ollen Bowl, MD   Assessment & Plan: Visit Diagnoses:  1. Chronic right-sided low back pain with right-sided sciatica     Plan: Trigger point area of injection did not provide much relief.  I believe that the problem originates from the lumbar spine.  Long discussion regarding her diagnosis and treatment options including physical therapy, MRI scan, further injections over 30 minutes 50% of the time in counseling.  Latoya Horton would like to try some therapy in [place].  We will prescribe this and have her return in 6 weeks or sooner should there be an exacerbation and consider the MRI scan  Follow-Up Instructions: Return in about 6 weeks (around 11/03/2019).   Orders:  No orders of the defined types were placed in this encounter.  No orders of the defined types were placed in this encounter.     Procedures: No procedures performed   Clinical Data: No additional findings.   Subjective: Chief Complaint  Patient presents with  . Right Hip - Follow-up  Patient presents today for follow up on her right hip pain. She said that she was here last month and received trigger point injections that did not seem to help. Her pain got better, but has since improved some. She pain is worse in the mornings. The pain is located in her right buttock area and travels down her leg. She takes Aleve if needed, but tries to avoid any type of pain medicine.  Not having much compromise of her activities and still goes to exercise class III times a week.  Not doing dedicated back exercises.  Certain activities will cause her to have right buttock pain and discomfort into her right thigh and calf similar to what she had in the past.  No left leg pain  HPI   Review of Systems   Objective: Vital Signs: BP (!) 160/94   Pulse 66   Ht 5\' 3"  (1.6 m)   Wt 150 lb (68 kg)   BMI 26.57 kg/m   Physical Exam Constitutional:      Appearance: She is well-developed.  Eyes:     Pupils: Pupils are equal, round, and reactive to light.  Pulmonary:     Effort: Pulmonary effort is normal.  Skin:    General: Skin is warm and dry.  Neurological:     Mental Status: She is alert and oriented to person, place, and time.  Psychiatric:        Behavior: Behavior normal.     Ortho Exam awake alert and oriented x3.  Comfortable sitting.  Straight leg raise negative.  Motor and sensory exam is intact.  No percussible tenderness over lumbar spine.  No pain with range of motion of either hip  Specialty Comments:  No specialty comments available.  Imaging: No results found.   PMFS History: Patient Active Problem List   Diagnosis Date Noted  . Low back pain 08/10/2019  . White coat hypertension 10/10/2014  . Cough 11/05/2012  . Pulmonary nodule 11/05/2012  . Chest pain 06/17/2012  . Bronchiectasis ([place]) 05/04/2012   Past Medical History:  Diagnosis Date  . GERD (  gastroesophageal reflux disease)   . IBS (irritable bowel syndrome)   . Multiple allergies   . Scar tissue    of the lungs    Family History  Problem Relation Age of Onset  . Heart disease Father 74       Died age 28  . Heart disease Brother 84       CHF  . Breast cancer Paternal Aunt   . Ovarian cancer Maternal Aunt   . Diabetes Brother     Past Surgical History:  Procedure Laterality Date  . TUBAL LIGATION  2004   Social History   Occupational History  . Occupation: Retired    Comment: Scientist, water quality  Tobacco Use  . Smoking status: Never Smoker  . Smokeless tobacco: Never Used  Substance and Sexual Activity  . Alcohol use: No  . Drug use: No  . Sexual activity: Not on file

## 2020-03-07 ENCOUNTER — Ambulatory Visit (INDEPENDENT_AMBULATORY_CARE_PROVIDER_SITE_OTHER): Payer: Medicare Other

## 2020-03-07 ENCOUNTER — Encounter: Payer: Self-pay | Admitting: Podiatry

## 2020-03-07 ENCOUNTER — Ambulatory Visit (INDEPENDENT_AMBULATORY_CARE_PROVIDER_SITE_OTHER): Payer: Medicare Other | Admitting: Podiatry

## 2020-03-07 ENCOUNTER — Other Ambulatory Visit: Payer: Self-pay

## 2020-03-07 VITALS — BP 169/90 | HR 72 | Temp 97.9°F | Resp 16

## 2020-03-07 DIAGNOSIS — M2042 Other hammer toe(s) (acquired), left foot: Secondary | ICD-10-CM

## 2020-03-07 DIAGNOSIS — G5762 Lesion of plantar nerve, left lower limb: Secondary | ICD-10-CM | POA: Diagnosis not present

## 2020-03-07 DIAGNOSIS — M205X2 Other deformities of toe(s) (acquired), left foot: Secondary | ICD-10-CM

## 2020-03-07 DIAGNOSIS — G5782 Other specified mononeuropathies of left lower limb: Secondary | ICD-10-CM

## 2020-03-07 DIAGNOSIS — L603 Nail dystrophy: Secondary | ICD-10-CM

## 2020-03-07 NOTE — Progress Notes (Signed)
Subjective:  Patient ID: Latoya Horton, female    DOB: [birth year],  MRN: [id number] HPI Chief Complaint  Patient presents with  . Toe Pain    2nd toe left - aching x few months, tips of toe curls under, certain shoes and activity makes worse, tried cushions in shoes  . Nail Problem    Hallux nails bilateral - discolored areas x few months, tried OTC fungi nail-no help  . New Patient (Initial Visit)    79 y.o. female presents with the above complaint.   Denies fever chills nausea vomiting muscle aches pains calf pain back pain chest pain shortness of breath.  Past Medical History:  Diagnosis Date  . GERD (gastroesophageal reflux disease)   . IBS (irritable bowel syndrome)   . Multiple allergies   . Scar tissue    of the lungs   Past Surgical History:  Procedure Laterality Date  . TUBAL LIGATION  2004    Current Outpatient Medications:  .  BIOTIN PO, Take by mouth., Disp: , Rfl:  .  TURMERIC PO, Take by mouth., Disp: , Rfl:  .  Calcium Carbonate-Vitamin D (CALCIUM 600 + D PO), Take 1 tablet by mouth 2 (two) times daily., Disp: , Rfl:  .  Cholecalciferol (D3-1000) 1000 UNITS capsule, Take 2,000 Units by mouth daily., Disp: , Rfl:  .  hydrochlorothiazide (HYDRODIURIL) 25 MG tablet, Take 25 mg by mouth daily., Disp: , Rfl:  .  lisinopril (ZESTRIL) 2.5 MG tablet, Take 2.5 mg by mouth daily., Disp: , Rfl:  .  Multiple Vitamins-Minerals (CENTRUM SILVER PO), Take 1 tablet by mouth daily., Disp: , Rfl:   Allergies  Allergen Reactions  . Bystolic [Nebivolol Hcl]     GI upset and HA  . Penicillins     Syncope, rash   Review of Systems Objective:   Vitals:   03/07/20 1441  BP: (!) 169/90  Pulse: 72  Resp: 16  Temp: 97.9 F (36.6 C)    General: Well developed, nourished, in no acute distress, alert and oriented x3   Dermatological: Skin is warm, dry and supple bilateral. Nails x 10 are well maintained; remaining integument appears unremarkable at this time. There are  no open sores, no preulcerative lesions, no rash or signs of infection present.  Hallux nails bilaterally demonstrate loosening along the fibular border more than likely associated with mild bunion deformity and hammertoe deformities.  No subungual debris.  Vascular: Dorsalis Pedis artery and Posterior Tibial artery pedal pulses are 2/4 bilateral with immedate capillary fill time. Pedal hair growth present. No varicosities and no lower extremity edema present bilateral.   Neruologic: Grossly intact via light touch bilateral. Vibratory intact via tuning fork bilateral. Protective threshold with Semmes Wienstein monofilament intact to all pedal sites bilateral. Patellar and Achilles deep tendon reflexes 2+ bilateral. No Babinski or clonus noted bilateral.   Musculoskeletal: No gross boney pedal deformities bilateral. No pain, crepitus, or limitation noted with foot and ankle range of motion bilateral. Muscular strength 5/5 in all groups tested bilateral.  She has pain on end range of motion of the third toe left foot and second metatarsal phalangeal joint left foot.  She also has a mallet toe deformity with tenderness on range of motion of the DIPJ third toe left foot.  Gait: Unassisted, Nonantalgic.    Radiographs:  Radiographs taken today demonstrate an osseously mature individual with mild osteoarthritic changes to the toes.  Significant osteopenia to the foot and in its entirety.  Assessment & Plan:  Assessment: Nail dystrophy hallux bilateral cannot rule out onychomycosis.  Capsulitis cannot rule out neuroma second interdigital space left.  Plan: Discussed etiology pathology conservative versus surgical therapies at this point I injected to the second intermetatarsal space 10 mg of Kenalog 5 mg Marcaine point of maximal tenderness after sterile Betadine skin prep.  Also took samples of the toenails today for evaluation.     Latoya Horton T. [place], [place]

## 2020-03-24 ENCOUNTER — Telehealth: Payer: Self-pay | Admitting: *Deleted

## 2020-03-24 NOTE — Telephone Encounter (Signed)
-----   Message from Garrel Ridgel, [place] sent at 03/21/2020  8:02 AM EDT ----- NEGATIVE FOR FUNGUS AND DOES NOT NEED FOLLOW UP WITH ME.

## 2020-03-24 NOTE — Telephone Encounter (Signed)
I informed pt of Dr. Stephenie Acres review of fungal results and offered to cancel the follow up appt. Pt states she was also seen for another problem and would like to keep the scheduled appt.

## 2020-04-13 ENCOUNTER — Ambulatory Visit: Payer: PRIVATE HEALTH INSURANCE | Admitting: Podiatry

## 2020-04-21 ENCOUNTER — Ambulatory Visit (INDEPENDENT_AMBULATORY_CARE_PROVIDER_SITE_OTHER): Payer: Medicare Other | Admitting: Physician Assistant

## 2020-04-21 ENCOUNTER — Encounter: Payer: Self-pay | Admitting: Physician Assistant

## 2020-04-21 ENCOUNTER — Other Ambulatory Visit: Payer: Self-pay

## 2020-04-21 DIAGNOSIS — C44519 Basal cell carcinoma of skin of other part of trunk: Secondary | ICD-10-CM

## 2020-04-21 DIAGNOSIS — C4491 Basal cell carcinoma of skin, unspecified: Secondary | ICD-10-CM

## 2020-04-21 NOTE — Progress Notes (Addendum)
   Follow-Up Visit   Subjective  Latoya Horton is a 79 y.o. female who presents for the following: Procedure (Here for treatment of BCC, pigmented on left low abdomen.). Here for surgery. Healed well     The following portions of the chart were reviewed this encounter and updated as appropriate: Tobacco  Allergies  Meds  Problems  Med Hx  Surg Hx  Fam Hx      Objective  Well appearing patient in no apparent distress; mood and affect are within normal limits.  A focused examination was performed including abdomen. Relevant physical exam findings are noted in the Assessment and Plan.  Objective  Left Abdomen (side) - Lower: Pink macule Treatment Cx3, Cautery, and 5FU Size 1.0cm  Assessment & Plan  Basal cell carcinoma (BCC), unspecified site Left Abdomen (side) - Lower  Destruction of lesion Complexity: simple   Destruction method: electrodesiccation and curettage   Informed consent: discussed and consent obtained   Timeout:  patient name, date of birth, surgical site, and procedure verified Anesthesia: the lesion was anesthetized in a standard fashion   Anesthetic:  1% lidocaine w/ epinephrine 1-100,000 local infiltration Curettage performed in three different directions: Yes   Electrodesiccation performed over the curetted area: Yes   Curettage cycles:  3 Lesion length (cm):  0.9 Lesion width (cm):  0.9 Margin per side (cm):  0.1 Final wound size (cm):  1.1 Hemostasis achieved with:  aluminum chloride Outcome: patient tolerated procedure well with no complications   Post-procedure details: wound care instructions given

## 2020-04-21 NOTE — Patient Instructions (Signed)

## 2020-06-10 NOTE — Addendum Note (Signed)
Addended by: Warren Danes on: 06/10/2020 03:39 PM   Modules accepted: Level of Service

## 2020-06-28 ENCOUNTER — Other Ambulatory Visit: Payer: Self-pay

## 2020-06-28 ENCOUNTER — Encounter: Payer: Self-pay | Admitting: Orthopaedic Surgery

## 2020-06-28 ENCOUNTER — Ambulatory Visit (INDEPENDENT_AMBULATORY_CARE_PROVIDER_SITE_OTHER): Payer: Medicare Other

## 2020-06-28 ENCOUNTER — Ambulatory Visit (INDEPENDENT_AMBULATORY_CARE_PROVIDER_SITE_OTHER): Payer: Medicare Other | Admitting: Orthopaedic Surgery

## 2020-06-28 VITALS — BP 148/86 | HR 66 | Ht 63.0 in | Wt 150.0 lb

## 2020-06-28 DIAGNOSIS — G8929 Other chronic pain: Secondary | ICD-10-CM

## 2020-06-28 DIAGNOSIS — M25511 Pain in right shoulder: Secondary | ICD-10-CM | POA: Diagnosis not present

## 2020-06-28 MED ORDER — METHYLPREDNISOLONE ACETATE 40 MG/ML IJ SUSP
80.0000 mg | INTRAMUSCULAR | Status: AC | PRN
  Administered 2020-06-28: 80 mg via INTRA_ARTICULAR

## 2020-06-28 MED ORDER — LIDOCAINE HCL 2 % IJ SOLN
2.0000 mL | INTRAMUSCULAR | Status: AC | PRN
  Administered 2020-06-28: 2 mL

## 2020-06-28 MED ORDER — BUPIVACAINE HCL 0.5 % IJ SOLN
2.0000 mL | INTRAMUSCULAR | Status: AC | PRN
  Administered 2020-06-28: 2 mL via INTRA_ARTICULAR

## 2020-06-28 NOTE — Progress Notes (Signed)
Office Visit Note   Patient: Latoya Horton           Date of Birth: 1941/10/09           MRN: [id number] Visit Date: 06/28/2020              Requested by: Ollen Bowl, MD [address] [place] [address] PCP: Ollen Bowl, MD   Assessment & Plan: Visit Diagnoses:  1. Chronic right shoulder pain     Plan: Mrs. Fenter has evidence of rotator cuff arthropathy.  She has had an exacerbation of her pain and would like to have a cortisone injection.  Have discussed the pathology with different treatment options including consideration of reverse shoulder arthroplasty.  At this point she would like to proceed with a cortisone injection and monitor response.  Follow-Up Instructions: Return if symptoms worsen or fail to improve.   Orders:  Orders Placed This Encounter  Procedures   Large Joint Inj: R subacromial bursa   XR Shoulder Right   No orders of the defined types were placed in this encounter.     Procedures: Large Joint Inj: R subacromial bursa on 06/28/2020 12:05 PM Indications: pain and diagnostic evaluation Details: 25 G 1.5 in needle, anterolateral approach  Arthrogram: No  Medications: 2 mL lidocaine 2 %; 2 mL bupivacaine 0.5 %; 80 mg methylPREDNISolone acetate 40 MG/ML Consent was given by the patient. Immediately prior to procedure a time out was called to verify the correct patient, procedure, equipment, support staff and site/side marked as required. Patient was prepped and draped in the usual sterile fashion.       Clinical Data: No additional findings.   Subjective: Chief Complaint  Patient presents with   Right Shoulder - Pain  Patient presents with right shoulder pain and is requesting an injection.  She describes increased pain with certain movements and feels that she "babies" her shoulder constantly.  She is taking OTC medications as needed and states that they help some.  Had an MRI scan of her right shoulder some  years ago.  This was reviewed.  She had a tear of the supraspinatus with 3 to 5 cm of retraction and also similar tearing of the infraspinatus.  There was atrophy of both tendons.  She had elected to try cortisone injections and exercises were then proceed with surgery  HPI  Review of Systems   Objective: Vital Signs: BP (!) 148/86    Pulse 66    Ht 5\' 3"  (1.6 m)    Wt 150 lb (68 kg)    BMI 26.57 kg/m   Physical Exam Constitutional:      Appearance: She is well-developed.  Eyes:     Pupils: Pupils are equal, round, and reactive to light.  Pulmonary:     Effort: Pulmonary effort is normal.  Skin:    General: Skin is warm and dry.  Neurological:     Mental Status: She is alert and oriented to person, place, and time.  Psychiatric:        Behavior: Behavior normal.     Ortho Exam right shoulder with full overhead motion actively but with a circuitous arc.  There is weakness with internal and external rotation.  There are areas of tenderness in the anterior and lateral subacromial region.  Positive Speed sign.  Difficult to determine if long head of the biceps tendon has ruptured.  Good grip and good release.  No evidence of adhesive capsulitis or instability  Specialty Comments:  No specialty comments available.  Imaging: XR Shoulder Right  Result Date: 06/28/2020 Films of the right shoulder obtained in several projections.  There is superior migration of the humeral head with only about 4 to 5 mm space between the humeral head and the acromion.  Type II acromion.  Degenerative changes at the acromioclavicular joint.  Very small inferior humeral head spur.  No ectopic calcification.  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   Tear of right rotator cuff 12/20/2014   White coat hypertension 10/10/2014   Epiretinal membrane 08/22/2014   Melanocytic nevi of eyelid or canthus 08/22/2014   Nuclear senile cataract 08/22/2014   Retinal drusen 08/22/2014   Colon polyp 10/18/2013   Cystocele, unspecified (CODE) 07/27/2013   Cough 11/05/2012   Pulmonary nodule 11/05/2012   Chest pain 06/17/2012   Bronchiectasis ([place]) 05/04/2012   Abnormal CXR 04/14/2012   Esophageal reflux 03/21/2009   Osteopenia 03/21/2009   Other specified cardiac arrhythmias 07/21/2007   Irritable bowel syndrome 05/28/2002   Past Medical History:  Diagnosis Date   Atypical mole 09/17/1999   minimal atypia on left upper breast (no treatment)   Basal cell carcinoma 03/25/1997   superficial on mid left back - CX3 + 5FU   Basal cell carcinoma 06/26/2005   superficial over left inner brow - CX3 + 5FU   Basal cell carcinoma 06/26/2005   superficial on left chest - MOHs   Basal cell carcinoma 09/11/2005   sclerosis w/ + margin - over left inner brow - MOHs   Basal cell carcinoma 06/04/2006   superficial on right upper back - CX3 + 5FU   Basal cell carcinoma 02/25/2012   superficial behind left ear - CX3 + 5FU   Basal cell carcinoma 02/24/2018   superficial on left anterior thigh - CX3+cautery+5FU   Basal cell carcinoma 03/02/2020   pigmented on left low abdomen - CX3+5FU   GERD (gastroesophageal reflux disease)    IBS (irritable bowel syndrome)    Multiple allergies    Scar tissue    of the lungs   Squamous cell carcinoma of skin 07/28/2006   KA on right shin - parenteral 5FU   Squamous cell carcinoma of skin 07/23/2016   well differentiated on left inner knee - CX3+5FU    Family History    Problem Relation Age of Onset   Heart disease Father 39       Died age 42   Heart disease Brother 55       CHF   Breast cancer Paternal Aunt    Ovarian cancer Maternal Aunt    Diabetes Brother     Past Surgical History:  Procedure Laterality Date   TUBAL LIGATION  2004   Social History   Occupational History   Occupation: Retired    Comment: Scientist, water quality  Tobacco Use   Smoking status: Never Smoker   Smokeless tobacco: Never Used  Scientific laboratory technician Use: Never used  Substance and Sexual Activity   Alcohol use: No  Drug use: No   Sexual activity: Not on file

## 2020-10-19 ENCOUNTER — Ambulatory Visit: Payer: Medicare Other | Admitting: Physician Assistant

## 2021-01-05 ENCOUNTER — Ambulatory Visit: Payer: Medicare Other | Admitting: Physician Assistant

## 2021-03-12 ENCOUNTER — Ambulatory Visit: Payer: Medicare Other | Admitting: Dermatology

## 2021-03-21 ENCOUNTER — Ambulatory Visit (INDEPENDENT_AMBULATORY_CARE_PROVIDER_SITE_OTHER): Payer: Medicare Other | Admitting: Orthopaedic Surgery

## 2021-03-21 ENCOUNTER — Other Ambulatory Visit: Payer: Self-pay

## 2021-03-21 ENCOUNTER — Encounter: Payer: Self-pay | Admitting: Orthopaedic Surgery

## 2021-03-21 DIAGNOSIS — M25511 Pain in right shoulder: Secondary | ICD-10-CM

## 2021-03-21 DIAGNOSIS — M12811 Other specific arthropathies, not elsewhere classified, right shoulder: Secondary | ICD-10-CM | POA: Diagnosis not present

## 2021-03-21 MED ORDER — LIDOCAINE HCL 1 % IJ SOLN
2.0000 mL | INTRAMUSCULAR | Status: AC | PRN
  Administered 2021-03-21: 2 mL

## 2021-03-21 MED ORDER — BUPIVACAINE HCL 0.25 % IJ SOLN
2.0000 mL | INTRAMUSCULAR | Status: AC | PRN
  Administered 2021-03-21: 2 mL via INTRA_ARTICULAR

## 2021-03-21 NOTE — Progress Notes (Signed)
Office Visit Note   Patient: Latoya Horton           Date of Birth: [birth year]           MRN: [id number] Visit Date: 03/21/2021              Requested by: Ollen Bowl, MD [address] [place] [address] PCP: Ollen Bowl, MD   Assessment & Plan: Visit Diagnoses:  1. Rotator cuff arthropathy, right     Plan: Recurrent symptoms of rotator cuff arthropathy right shoulder.  Still no interest in surgery.  Will reinject with betamethasone  Follow-Up Instructions: Return if symptoms worsen or fail to improve.   Orders:  Orders Placed This Encounter  Procedures  . Large Joint Inj: R glenohumeral   No orders of the defined types were placed in this encounter.     Procedures: Large Joint Inj: R glenohumeral on 03/21/2021 10:31 AM Indications: pain and diagnostic evaluation Details: 25 G 1.5 in needle, anteromedial approach  Arthrogram: No  Medications: 2 mL lidocaine 1 %; 2 mL bupivacaine 0.25 %  12 mg betamethasone injected into right shoulder glenohumeral joint with Marcaine and Xylocaine Consent was given by the patient. Immediately prior to procedure a time out was called to verify the correct patient, procedure, equipment, support staff and site/side marked as required. Patient was prepped and draped in the usual sterile fashion.       Clinical Data: No additional findings.   Subjective: Chief Complaint  Patient presents with  . Right Shoulder - Pain  Patient presents today for recurrent chronic right shoulder pain. She was here last in June of 2021 and received a cortisone injection. She states that they always help, but the pain has returned. The pain comes and goes, and will sometimes wake her at night. She takes Tylenol as needed. She is wanting to get another cortisone injection today.   HPI  Review of Systems   Objective: Vital Signs: Ht 5\' 3"  (1.6 m)   Wt 150 lb (68 kg)   BMI 26.57 kg/m   Physical Exam Constitutional:       Appearance: She is well-developed.  Eyes:     Pupils: Pupils are equal, round, and reactive to light.  Pulmonary:     Effort: Pulmonary effort is normal.  Skin:    General: Skin is warm and dry.  Neurological:     Mental Status: She is alert and oriented to person, place, and time.  Psychiatric:        Behavior: Behavior normal.     Ortho Exam awake alert and oriented x3.  Comfortable sitting Limited range of motion of right shoulder consistent with rotator cuff arthropathy associated with considerable weakness.  Painful overhead motion.  There is some crepitation with internal and external rotation.  Good grip and release  Specialty Comments:  No specialty comments available.  Imaging: No results found.   PMFS History: Patient Active Problem List   Diagnosis Date Noted  . Rotator cuff arthropathy, right 03/21/2021  . Pain in right shoulder 06/28/2020  . Low back pain 08/10/2019  . Macular pseudohole of left eye 05/06/2019  . 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 Hypertension, essential, benign 06/09/2018  . White coat syndrome with diagnosis of hypertension 04/10/2016  . Hyperglycemia 03/31/2016  . Mixed hyperlipidemia 03/31/2016  . Allergic rhinitis 12/20/2014  . 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 Tear of right rotator cuff 12/20/2014  . White coat hypertension 10/10/2014  .  Epiretinal membrane 08/22/2014  . Melanocytic nevi of eyelid or canthus 08/22/2014  . Nuclear senile cataract 08/22/2014  . Retinal drusen 08/22/2014  . Colon polyp 10/18/2013  . Cystocele, unspecified (CODE) 07/27/2013  . Cough 11/05/2012  . Pulmonary nodule 11/05/2012  . Chest pain 06/17/2012  . Bronchiectasis ([place]) 05/04/2012  . Abnormal CXR 04/14/2012  . Esophageal reflux 03/21/2009  . Osteopenia 03/21/2009  . Other specified cardiac arrhythmias 07/21/2007  . Irritable bowel syndrome 05/28/2002   Past Medical History:  Diagnosis Date  . Atypical mole 09/17/1999   minimal atypia  on left upper breast (no treatment)  . Basal cell carcinoma 03/25/1997   superficial on mid left back - CX3 + 5FU  . Basal cell carcinoma 06/26/2005   superficial over left inner brow - CX3 + 5FU  . Basal cell carcinoma 06/26/2005   superficial on left chest - MOHs  . Basal cell carcinoma 09/11/2005   sclerosis w/ + margin - over left inner brow - MOHs  . Basal cell carcinoma 06/04/2006   superficial on right upper back - CX3 + 5FU  . Basal cell carcinoma 02/25/2012   superficial behind left ear - CX3 + 5FU  . Basal cell carcinoma 02/24/2018   superficial on left anterior thigh - CX3+cautery+5FU  . Basal cell carcinoma 03/02/2020   pigmented on left low abdomen - CX3+5FU  . GERD (gastroesophageal reflux disease)   . IBS (irritable bowel syndrome)   . Multiple allergies   . Scar tissue    of the lungs  . Squamous cell carcinoma of skin 07/28/2006   KA on right shin - parenteral 5FU  . Squamous cell carcinoma of skin 07/23/2016   well differentiated on left inner knee - CX3+5FU    Family History  Problem Relation Age of Onset  . Heart disease Father 57       Died age 48  . Heart disease Brother 41       CHF  . Breast cancer Paternal Aunt   . Ovarian cancer Maternal Aunt   . Diabetes Brother     Past Surgical History:  Procedure Laterality Date  . TUBAL LIGATION  2004   Social History   Occupational History  . Occupation: Retired    Comment: Scientist, water quality  Tobacco Use  . Smoking status: Never Smoker  . Smokeless tobacco: Never Used  Vaping Use  . Vaping Use: Never used  Substance and Sexual Activity  . Alcohol use: No  . Drug use: No  . Sexual activity: Not on file

## 2021-04-11 ENCOUNTER — Other Ambulatory Visit: Payer: Self-pay

## 2021-04-11 ENCOUNTER — Encounter: Payer: Self-pay | Admitting: Dermatology

## 2021-04-11 ENCOUNTER — Ambulatory Visit (INDEPENDENT_AMBULATORY_CARE_PROVIDER_SITE_OTHER): Payer: Medicare Other | Admitting: Dermatology

## 2021-04-11 DIAGNOSIS — L729 Follicular cyst of the skin and subcutaneous tissue, unspecified: Secondary | ICD-10-CM

## 2021-04-11 DIAGNOSIS — D1801 Hemangioma of skin and subcutaneous tissue: Secondary | ICD-10-CM

## 2021-04-11 DIAGNOSIS — Z1283 Encounter for screening for malignant neoplasm of skin: Secondary | ICD-10-CM | POA: Diagnosis not present

## 2021-04-11 DIAGNOSIS — L821 Other seborrheic keratosis: Secondary | ICD-10-CM

## 2021-04-11 DIAGNOSIS — Z85828 Personal history of other malignant neoplasm of skin: Secondary | ICD-10-CM | POA: Diagnosis not present

## 2021-04-22 ENCOUNTER — Encounter: Payer: Self-pay | Admitting: Dermatology

## 2021-04-22 NOTE — Progress Notes (Signed)
   Follow-Up Visit   Subjective  Latoya Horton is a 80 y.o. female who presents for the following: Annual Exam (Here for full body skin examination- wants lesions above lip and on neck removed. Personal history of non mole skin cancers. ).  General skin examination Location:  Duration:  Quality:  Associated Signs/Symptoms: Modifying Factors:  Severity:  Timing: Context:   Objective  Well appearing patient in no apparent distress; mood and affect are within normal limits. Objective  Head to Toe: Clear exam today no signs of atypical moles, melanoma or non mole skin cancer.  Objective  Left Inframammary Fold, Left Lower Leg - Anterior, Right Inframammary Fold: Flattopped brown keratotic 3 to 7 mm papules  Objective  Left Mid Back: Multiple locations, scars clear today.  Objective  Right Abdomen (side) - Upper: Four millimeter smooth red vascular papule  Objective  Left Upper Cutaneous Lip, Right Anterior Neck: Small white dermal papules.    A full examination was performed including scalp, head, eyes, ears, nose, lips, neck, chest, axillae, abdomen, back, buttocks, bilateral upper extremities, bilateral lower extremities, hands, feet, fingers, toes, fingernails, and toenails. All findings within normal limits unless otherwise noted below.  Areas beneath undergarments not fully examined.   Assessment & Plan    Skin exam for malignant neoplasm Head to Toe  Yearly skin check  Seborrheic keratosis (3) Left Lower Leg - Anterior; Left Inframammary Fold; Right Inframammary Fold  Clinically benign, patient may choose to remove in future  History of basal cell carcinoma (BCC) Left Mid Back  Check as needed change  Hemangioma of skin Right Abdomen (side) - Upper  Benign okay to leave.  Cyst of skin (2) Left Upper Cutaneous Lip; Right Anterior Neck  Benign okay to leave unless patient wants removed.  Discussed surgical technique.  She will decide if she  wants to schedule surgery.      I, Lavonna Monarch, MD, have reviewed all documentation for this visit.  The documentation on 04/22/21 for the exam, diagnosis, procedures, and orders are all accurate and complete.

## 2021-05-24 ENCOUNTER — Encounter: Payer: Medicare Other | Admitting: Dermatology

## 2021-06-21 ENCOUNTER — Encounter: Payer: Medicare Other | Admitting: Dermatology

## 2021-08-02 ENCOUNTER — Ambulatory Visit (INDEPENDENT_AMBULATORY_CARE_PROVIDER_SITE_OTHER): Payer: Medicare Other | Admitting: Dermatology

## 2021-08-02 ENCOUNTER — Other Ambulatory Visit: Payer: Self-pay

## 2021-08-02 ENCOUNTER — Encounter: Payer: Self-pay | Admitting: Dermatology

## 2021-08-02 DIAGNOSIS — L720 Epidermal cyst: Secondary | ICD-10-CM | POA: Diagnosis not present

## 2021-08-02 NOTE — Patient Instructions (Signed)

## 2021-08-09 ENCOUNTER — Ambulatory Visit (INDEPENDENT_AMBULATORY_CARE_PROVIDER_SITE_OTHER): Payer: Medicare Other | Admitting: *Deleted

## 2021-08-09 ENCOUNTER — Other Ambulatory Visit: Payer: Self-pay

## 2021-08-09 DIAGNOSIS — Z4802 Encounter for removal of sutures: Secondary | ICD-10-CM

## 2021-08-09 NOTE — Progress Notes (Signed)
Here for suture removal x 2- no sign or symptom of infection, pathology to patient.

## 2021-08-20 ENCOUNTER — Encounter: Payer: Self-pay | Admitting: Dermatology

## 2021-08-20 NOTE — Progress Notes (Signed)
   Follow-Up Visit   Subjective  Marlowe Cross is a 80 y.o. female who presents for the following: Procedure (Patient here today for cyst removal above lip and on the right side of her neck. ).  Cyst front of neck Location:  Duration:  Quality:  Associated Signs/Symptoms: Modifying Factors:  Severity:  Timing: Context:   Objective  Well appearing patient in no apparent distress; mood and affect are within normal limits. Right Anterior Neck 8 mm noninflamed dermal papule typical of epidermoid cyst.  Patient requests removal.  Told preoperatively risks of treatment failure, scar, infection, noncoverage by health insurance.  She requested we proceed.    A focused examination was performed including head and neck.. Relevant physical exam findings are noted in the Assessment and Plan.   Assessment & Plan    Epidermal cyst Right Anterior Neck  Skin excision - Right Anterior Neck  Lesion length (cm):  1 Lesion width (cm):  0.6 Margin per side (cm):  0 Total excision diameter (cm):  1 Informed consent: discussed and consent obtained   Timeout: patient name, date of birth, surgical site, and procedure verified   Anesthesia: the lesion was anesthetized in a standard fashion   Anesthetic:  1% lidocaine w/ epinephrine 1-100,000 local infiltration Instrument used: #15 blade   Hemostasis achieved with: suture and electrodesiccation   Outcome: patient tolerated procedure well with no complications   Post-procedure details: sterile dressing applied and wound care instructions given   Dressing type: bandage, petrolatum and pressure dressing    Specimen 1 - Surgical pathology Differential Diagnosis: cyst  Check Margins: No      I, Lavonna Monarch, MD, have reviewed all documentation for this visit.  The documentation on 08/20/21 for the exam, diagnosis, procedures, and orders are all accurate and complete.

## 2021-11-28 ENCOUNTER — Other Ambulatory Visit: Payer: Self-pay

## 2021-11-28 ENCOUNTER — Ambulatory Visit (INDEPENDENT_AMBULATORY_CARE_PROVIDER_SITE_OTHER): Payer: Medicare Other | Admitting: Orthopaedic Surgery

## 2021-11-28 ENCOUNTER — Encounter: Payer: Self-pay | Admitting: Orthopaedic Surgery

## 2021-11-28 VITALS — Ht 63.0 in | Wt 148.0 lb

## 2021-11-28 DIAGNOSIS — G8929 Other chronic pain: Secondary | ICD-10-CM

## 2021-11-28 DIAGNOSIS — M25511 Pain in right shoulder: Secondary | ICD-10-CM

## 2021-11-28 MED ORDER — LIDOCAINE HCL 1 % IJ SOLN
4.0000 mL | INTRAMUSCULAR | Status: AC | PRN
Start: 2021-11-28 — End: 2021-11-28
  Administered 2021-11-28: 4 mL

## 2021-11-28 MED ORDER — METHYLPREDNISOLONE ACETATE 40 MG/ML IJ SUSP
80.0000 mg | INTRAMUSCULAR | Status: AC | PRN
Start: 2021-11-28 — End: 2021-11-28
  Administered 2021-11-28: 80 mg via INTRA_ARTICULAR

## 2021-11-28 NOTE — Progress Notes (Signed)
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  She has had some success with injections and would like to go forward with another injection into her right shoulder today.  She understands had some point if she did not get adequate relief with injections that she could consider a reverse total shoulder arthroplasty.  She is having bladder surgery in January so would like to just go with injections for now.  She also understands if she wishes to go forward with reverse total shoulder we would refer her to Dr. Alphonzo Severance.    Follow-Up Instructions: No follow-ups on file.   Orders:  No orders of the defined types were placed in this encounter.  No orders of the defined types were placed in this encounter.     Procedures: Large Joint Inj on 11/28/2021 10:50 AM Indications: diagnostic evaluation and pain Details: 25 G 1.5 in needle, anterior approach  Arthrogram: No  Medications: 4 mL lidocaine 1 %; 80 mg methylPREDNISolone acetate 40 MG/ML Outcome: tolerated well, no immediate complications Procedure, treatment alternatives, risks and benefits explained, specific risks discussed. Consent was given by the patient.      Clinical Data: No additional findings.   Subjective: Chief Complaint  Patient presents with   Right Shoulder - Pain  Patient presents with chronic right shoulder pain. She requests repeat injection. Last injection 03/21/2021.    Review of Systems  All other systems reviewed and are negative.   Objective: Vital Signs: Ht 5\' 3"  (1.6 m)    Wt 148 lb (67.1 kg)   BMI 26.22 kg/m   Physical Exam Patient appears well comfortable to exam Ortho Exam Examination of her right shoulder demonstrates distal CMS is intact she has pain and limited motion with forward elevation external rotation and internal rotation around behind her back.  No swelling or effusion no erythema s  No specialty comments available.  Imaging: No results found.   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   Tear of right rotator cuff 12/20/2014   White coat hypertension 10/10/2014   Epiretinal membrane 08/22/2014   Melanocytic nevi of eyelid or canthus 08/22/2014   Nuclear senile cataract 08/22/2014   Retinal drusen 08/22/2014   Colon polyp 10/18/2013   Cystocele, unspecified (CODE) 07/27/2013   Cough 11/05/2012   Pulmonary nodule 11/05/2012   Chest pain 06/17/2012   Bronchiectasis ([place]) 05/04/2012   Abnormal CXR 04/14/2012   Esophageal reflux 03/21/2009   Osteopenia 03/21/2009   Other specified cardiac arrhythmias 07/21/2007   Irritable bowel syndrome 05/28/2002  Past Medical History:  Diagnosis Date   Atypical mole 09/17/1999   minimal atypia on left upper breast (no treatment)   Basal cell carcinoma 03/25/1997   superficial on mid left back - CX3 + 5FU   Basal cell carcinoma 06/26/2005   superficial over left inner brow - CX3 + 5FU   Basal cell carcinoma 06/26/2005   superficial on left chest - MOHs   Basal cell carcinoma 09/11/2005   sclerosis w/ + margin - over left inner brow - MOHs    Basal cell carcinoma 06/04/2006   superficial on right upper back - CX3 + 5FU   Basal cell carcinoma 02/25/2012   superficial behind left ear - CX3 + 5FU   Basal cell carcinoma 02/24/2018   superficial on left anterior thigh - CX3+cautery+5FU   Basal cell carcinoma 03/02/2020   pigmented on left low abdomen - CX3+5FU   GERD (gastroesophageal reflux disease)    IBS (irritable bowel syndrome)    Multiple allergies    Scar tissue    of the lungs   Squamous cell carcinoma of skin 07/28/2006   KA on right shin - parenteral 5FU   Squamous cell carcinoma of skin 07/23/2016   well differentiated on left inner knee - CX3+5FU    Family History  Problem Relation Age of Onset   Heart disease Father 14       Died age 14   Heart disease Brother 58       CHF   Breast cancer Paternal Aunt    Ovarian cancer Maternal Aunt    Diabetes Brother     Past Surgical History:  Procedure Laterality Date   TUBAL LIGATION  2004   Social History   Occupational History   Occupation: Retired    Comment: Scientist, water quality  Tobacco Use   Smoking status: Never   Smokeless tobacco: Never  Vaping Use   Vaping Use: Never used  Substance and Sexual Activity   Alcohol use: No   Drug use: No   Sexual activity: Not on file

## 2022-08-20 ENCOUNTER — Ambulatory Visit: Payer: Medicare Other | Admitting: Physician Assistant

## 2023-05-28 NOTE — Progress Notes (Unsigned)
Subjective:   Patient ID: Latoya Horton, female    DOB: 1941-09-19  MRN: [id number]   Brief patient profile:  38 yowf never smoker with no respiratory problems x for eye allergies dx in [place] with allergy shots x 1990's  Only with occular complaints  until Dec 2012 with ? Viral pneumonia/ cough since >  referred by Dr Criss Alvine 05/04/2012 to pulmonary clinic with CT confirmed bronchiectasis.    History of Present Illness  05/04/2012 1st pulmonary eval cc acute onset cough since Dec 2012 "viral infection" overall 90% better ? If inhaler helped but no longer on it,  ? With exercise if gets supine, does ok walking 2 miles outdoors. No flare of allergy symptoms with this illness. Also new L flank post lat chest only if lie on it. Mucus was yellow now white, was first thing in am then resolved. 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 mucinex 600 mg 1-2 every 12 hours as needed for cough or congestion GERD diet     04/12/2014 f/u ov/Latoya Horton re:  /bronchiectasis Chief Complaint  Patient presents with   Follow-up    Pt states breathing improved but then got the flu and bronchitis in Jan. Pts breathing has now improved overall. Denies SOB, cough and CP.   Not limited by breathing from desired activities   Took zpak then second abx and now back to baseline cough  rec Prevnar 13 today Next flare of nasty mucus > try doxycycline 100 mg twice daily before eating with glass of water For cough mucinex dm up to  1200 mg every 12 hours as needed and immediately start Try prilosec 20mg   Take 30-60 min before first meal of the day and Pepcid 20 mg one bedtime until cough is completely gone for at least a week without the need for cough suppression       04/10/2015 f/u ov/Latoya Horton re: bronchiectasis / did not take doxy/ rarely uses  mucinex  Chief Complaint  Patient presents with   Follow-up    Pt states that she is  having slight increase in SOB since allergy started "but doing pretty good".  She has not used albuterol in about 3 months.   takes tums "right much" Has not used doxy Mucus most prod first thing in am and in certain positions on mat doing aerobics but not purulent or bloody  Not using saba at all  No flutter valve  rec Pantoprazole (protonix) 40 mg   Take 30-60 min before first meal of the day and Pepcid ac 20 mg one bedtime until return to office   GERD diet  Add flutter valve as needed for cough/congestion    10/17/2015  f/u ov/Latoya Horton re: bronchiectasis  Chief Complaint  Patient presents with   Follow-up    Pt states her cough has improved. Breathing is doing well. She has not needed albuterol.     Not limited by breathing from desired activities   - cough much better since rx for gerd started  Rec Flu shot today  Stay on protonix 40 mg daily and if cough worsens then add back pepcid 20 mg at bedtime    05/29/2023  f/u ov/Latoya Horton re: bronchiectasis    maint on ***  No chief complaint on file.   Dyspnea:  *** Cough: *** Sleeping: *** SABA use: *** 02: *** Covid status:   *** Lung cancer screening :  ***  No obvious day to day or daytime variability or assoc excess/ purulent sputum or mucus plugs or hemoptysis or cp or chest tightness, subjective wheeze or overt sinus or hb symptoms.   *** without nocturnal  or early am exacerbation  of respiratory  c/o's or need for noct saba. Also denies any obvious fluctuation of symptoms with weather or environmental changes or other aggravating or alleviating factors except as outlined above   No unusual exposure hx or h/o childhood pna/ asthma or knowledge of premature birth.  Current Allergies, Complete Past Medical History, Past Surgical History, Family History, and Social History were reviewed in [employer] record.  ROS  The following are not active complaints unless bolded Hoarseness, sore throat, dysphagia,  dental problems, itching, sneezing,  nasal congestion or discharge of excess mucus or purulent secretions, ear ache,   fever, chills, sweats, unintended wt loss or wt gain, classically pleuritic or exertional cp,  orthopnea pnd or arm/hand swelling  or leg swelling, presyncope, palpitations, abdominal pain, anorexia, nausea, vomiting, diarrhea  or change in bowel habits or change in bladder habits, change in stools or change in urine, dysuria, hematuria,  rash, arthralgias, visual complaints, headache, numbness, weakness or ataxia or problems with walking or coordination,  change in mood or  memory.        No outpatient medications have been marked as taking for the 05/29/23 encounter (Appointment) with Nyoka Cowden, MD.                 Objective:  Physical Exam  wts   05/29/2023       ***   04/10/15 155 lb (70.308 kg)  10/10/14 155 lb 12.8 oz (70.67 kg)  04/12/14 152 lb 6.4 oz (69.128 kg)  Wt 151 05/04/2012  > 06/17/2012  150 > 10/17/2015   151  Vital signs reviewed  05/29/2023  - Note at rest 02 sats  ***% on ***   General appearance:    ***          Assessment & Plan:

## 2023-05-29 ENCOUNTER — Encounter: Payer: Self-pay | Admitting: Internal Medicine

## 2023-05-29 ENCOUNTER — Ambulatory Visit (INDEPENDENT_AMBULATORY_CARE_PROVIDER_SITE_OTHER): Payer: Medicare Other | Admitting: Internal Medicine

## 2023-05-29 ENCOUNTER — Ambulatory Visit (INDEPENDENT_AMBULATORY_CARE_PROVIDER_SITE_OTHER): Payer: Medicare Other

## 2023-05-29 VITALS — BP 160/82 | HR 57 | Temp 98.1°F | Ht 63.0 in | Wt 146.0 lb

## 2023-05-29 DIAGNOSIS — J479 Bronchiectasis, uncomplicated: Secondary | ICD-10-CM | POA: Diagnosis not present

## 2023-05-29 DIAGNOSIS — I10 Essential (primary) hypertension: Secondary | ICD-10-CM

## 2023-05-29 DIAGNOSIS — R053 Chronic cough: Secondary | ICD-10-CM

## 2023-05-29 MED ORDER — VALSARTAN 80 MG PO TABS
80.0000 mg | ORAL_TABLET | Freq: Every day | ORAL | 2 refills | Status: DC
Start: 2023-05-29 — End: 2024-04-22

## 2023-05-29 NOTE — Assessment & Plan Note (Signed)
Flare since Jan 2024 on ACEi > d/c'd 05/29/2023 and rec also short term gerd rx   In the best review of chronic cough to date ( NEJM 2016 375 [id number]) ,  ACEi are now felt to cause cough in up to  20% of pts which is a 4 fold increase from previous reports and does not include the variety of non-specific complaints we see in pulmonary clinic in pts on ACEi but previously attributed to another dx like  Copd/asthma and  include PNDS, throat and chest congestion= globus, "bronchitis", unexplained dyspnea and noct "strangling" sensations, and hoarseness, but also  atypical /refractory GERD symptoms like dysphagia and "bad heartburn"   The only way I know  to prove this is not an "ACEi Case" is a trial off ACEi x a minimum of 6 weeks then regroup.   Try diovan 80 mg and self titrate > if all better can get f/u per PCP, otherwise return here to regroup.         Each maintenance medication was reviewed in detail including emphasizing most importantly the difference between maintenance and prns and under what circumstances the prns are to be triggered using an action plan format where appropriate.  Total time for H and P, chart review, counseling, and generating customized AVS unique to this office visit / same day charting = 45 min with pt not seen in > 3 y

## 2023-05-29 NOTE — Assessment & Plan Note (Signed)
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   These are the same pts (now being labeled as having "irritable larynx syndrome" by some cough centers) who not infrequently have a history of having failed to tolerate ace inhibitors(as is likely now the case here) ,  dry powder inhalers or biphosphonates or report having atypical/extraesophageal reflux symptoms that don't respond to standard doses of PPI (which may create cyclical coughing since gerd causes coughing which causes more coughing)  and are easily confused as having aecopd or asthma flares by even experienced allergists/ pulmonologists (myself included).   >>> see recs above

## 2023-05-29 NOTE — Patient Instructions (Addendum)
GERD (REFLUX)  is an extremely common cause of respiratory symptoms just like yours , many times with no obvious heartburn at all.    It can be treated with medication, but also with lifestyle changes including elevation of the head of your bed (ideally with 6 -8inch blocks under the headboard of your bed),  Smoking cessation, avoidance of late meals, excessive alcohol, and avoid fatty foods, chocolate, peppermint, colas, red wine, and acidic juices such as orange juice.  NO MINT OR MENTHOL PRODUCTS SO NO COUGH DROPS  USE SUGARLESS CANDY INSTEAD (Jolley ranchers or Stover's or Life Savers) or even ice chips will also do - the key is to swallow to prevent all throat clearing. NO OIL BASED VITAMINS - use powdered substitutes.  Avoid fish oil when coughing.   Try prilosec (omeprazole) otc 20mg   Take 30-60 min before first meal of the day and Pepcid ac (famotidine) 20 mg one @  bedtime until cough is completely gone for at least a week without the need for cough suppression     For cough > tessalon 200 mg every 6 hours as needed   Stop lisinopril and start valsartan 80 mg one daily - take twice daily if not satisfied   Please remember to go to the  x-ray department  for your tests - we will call you with the results when they are available     If you are satisfied with your treatment plan,  let your doctor know and he/she can either refill your medications or you can return here when your prescription runs out.     If in any way you are not 100% satisfied,  please tell us.  If 100% better, tell your friends!  Pulmonary follow up is as needed

## 2023-05-29 NOTE — Assessment & Plan Note (Signed)
Never smoker    - CXR 02/2009 min changes in RML vs Lingula     -  Outside CT 04/13/12 c/w rml > lingular  Bronchiectasis    -  PFT's 06/17/2012 FEV1  1.90 (96%) ratio 73 and no change p B2,  DLCO 98%  - CT 10/11/13    1. The suspected pulmonary nodule on chest x-ray represents a small focal area of parenchymal and pleural scarring posteriorly in the left apex. There is no worrisome pulmonary nodule. 2. Multiple small areas of tree-in-bud infiltrates in the right lung. This is nonspecific but has been associated with mycobacterium infections. 3. 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

## 2023-08-14 ENCOUNTER — Ambulatory Visit (INDEPENDENT_AMBULATORY_CARE_PROVIDER_SITE_OTHER): Payer: Medicare Other | Admitting: Orthopaedic Surgery

## 2023-08-14 ENCOUNTER — Encounter: Payer: Self-pay | Admitting: Orthopaedic Surgery

## 2023-08-14 VITALS — Ht 63.0 in | Wt 144.0 lb

## 2023-08-14 DIAGNOSIS — M25511 Pain in right shoulder: Secondary | ICD-10-CM | POA: Diagnosis not present

## 2023-08-14 DIAGNOSIS — G8929 Other chronic pain: Secondary | ICD-10-CM | POA: Diagnosis not present

## 2023-08-14 NOTE — Progress Notes (Signed)
She has shoulder pain, chronically with acute flare up about ten days ago after doing aerobics.  She would like an injection.  She knows she is a candidate for a total shoulder.  PROCEDURE NOTE:  The patient request injection, verbal consent was obtained.  The right shoulder was prepped appropriately after time out was performed.   Sterile technique was observed and injection of 1 cc of DepoMedrol 40mg  with several cc's of plain xylocaine. Anesthesia was provided by ethyl chloride and a 20-gauge needle was used to inject the shoulder area. A posterior approach was used.  The injection was tolerated well.  A band aid dressing was applied.  The patient was advised to apply ice later today and tomorrow to the injection sight as needed.  Encounter Diagnosis  Name Primary?   Chronic right shoulder pain Yes   Return prn.  Call if any problem.  Precautions discussed.  Electronically Signed Darreld Mclean, MD 8/15/202410:26 AM

## 2024-04-07 ENCOUNTER — Encounter: Payer: Self-pay | Admitting: Physician Assistant

## 2024-04-07 ENCOUNTER — Ambulatory Visit: Admitting: Physician Assistant

## 2024-04-07 ENCOUNTER — Other Ambulatory Visit (INDEPENDENT_AMBULATORY_CARE_PROVIDER_SITE_OTHER): Payer: Self-pay

## 2024-04-07 DIAGNOSIS — M25511 Pain in right shoulder: Secondary | ICD-10-CM

## 2024-04-07 DIAGNOSIS — G8929 Other chronic pain: Secondary | ICD-10-CM

## 2024-04-07 MED ORDER — METHYLPREDNISOLONE ACETATE 40 MG/ML IJ SUSP
40.0000 mg | Freq: Once | INTRAMUSCULAR | Status: AC
  Administered 2024-04-07: 40 mg via INTRAMUSCULAR

## 2024-04-07 NOTE — Progress Notes (Signed)
 Office Visit Note   Patient: Latoya Horton           Date of Birth: 1941-03-24           MRN: [id number] Visit Date: 04/07/2024              Requested by: Latoya Diamond, MD [address] [place],  [place] [address] PCP: Latoya Diamond, MD   Assessment & Plan: Visit Diagnoses:  1. Chronic right shoulder pain     Plan: Patient is a pleasant 83 year old woman comes in today with right shoulder pain.  She has a long history of shoulder issues with this shoulder and has been treated by Dr. Cleophas Dunker and once by Dr. Hilda Lias with injections.  She said she actually had been doing fairly well though she babysat her right arm because of the rotator cuff issues and is not as strong.  She said she was reaching down in the shower and felt a pop in the front of her shoulder.  Denies any ecchymosis reports pain in the shoulder going down into her biceps tendon.  Findings consistent with rotator cuff pathology as well as biceps tendon proximal tearing.  Her distal biceps is intact.  She does have some atrophy her biceps actually on the right and she admits she does not use this arm much.  We will try a steroid injection today.  Also will refer her to physical therapy in [place].  She does live in [place] and as she is seeing Dr. Hilda Lias before I will refer her to see Dr. Hilda Lias in the [place] office  Follow-Up Instructions: No follow-ups on file.   Orders:  Orders Placed This Encounter  Procedures   XR Shoulder Right   No orders of the defined types were placed in this encounter.     Procedures: No procedures performed   Clinical Data: No additional findings.   Subjective: No chief complaint on file.   HPI pleasant active 83 year old woman comes in today with a chief complaint of right shoulder pain.  She has a history of rotator cuff tearing in the right shoulder.  She also has a history of arthritis in both glenohumeral and AC joint.  She has treated this her conservatively  with injections with Dr. Cleophas Dunker and then also with Dr. Hilda Lias she had a recurrence of symptoms when she was bending her arm in the shower and heard a pop  Review of Systems  All other systems reviewed and are negative.    Objective: Vital Signs: There were no vitals taken for this visit.  Physical Exam Constitutional:      Appearance: Normal appearance.  Skin:    General: Skin is warm and dry.  Neurological:     General: No focal deficit present.     Mental Status: She is alert and oriented to person, place, and time.    Ortho Exam Right arm.  She is distal biceps is intact she has some biceps atrophy actually on the right more than the left.  She is tender over the proximal insertion she does not have any bruising or swelling she has good forward elevation internal rotation behind her back her strength is slightly weaker than on the left side.  She has positive empty can test and positive speeds test no pain really with external rotation she is neurovascular intact Specialty Comments:  No specialty comments available.  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   Tear of right rotator cuff 12/20/2014   White coat hypertension 10/10/2014   Epiretinal membrane 08/22/2014   Melanocytic nevi of eyelid or canthus  08/22/2014   Nuclear senile cataract 08/22/2014   Retinal drusen 08/22/2014   Colon polyp 10/18/2013   Cystocele, unspecified (CODE) 07/27/2013   Cough 11/05/2012   Pulmonary nodule 11/05/2012   Chest pain 06/17/2012   Bronchiectasis ([facility]) 05/04/2012   Abnormal CXR 04/14/2012   Esophageal reflux 03/21/2009   Osteopenia 03/21/2009   Other specified cardiac arrhythmias 07/21/2007   Irritable bowel syndrome 05/28/2002   Past Medical History:  Diagnosis Date   Atypical mole 09/17/1999   minimal atypia on left upper breast (no treatment)   Basal cell carcinoma 03/25/1997   superficial on mid left back - CX3 + 5FU   Basal cell carcinoma 06/26/2005   superficial over left inner brow - CX3 + 5FU   Basal cell carcinoma 06/26/2005   superficial on left chest - MOHs   Basal cell carcinoma 09/11/2005   sclerosis w/ + margin - over left inner brow - MOHs   Basal cell carcinoma 06/04/2006   superficial on right upper back - CX3 + 5FU   Basal cell carcinoma 02/25/2012   superficial behind left ear - CX3 + 5FU   Basal cell carcinoma 02/24/2018   superficial on left anterior thigh - CX3+cautery+5FU   Basal cell carcinoma 03/02/2020   pigmented on left low abdomen - CX3+5FU   GERD (gastroesophageal reflux disease)    IBS (irritable bowel syndrome)    Multiple allergies    Scar tissue    of the lungs   Squamous cell carcinoma of skin 07/28/2006   KA on right shin - parenteral 5FU   Squamous cell carcinoma of skin 07/23/2016   well differentiated on left inner knee - CX3+5FU    Family History  Problem Relation Age of Onset   Heart disease Father 3       Died age 31   Heart disease Brother 63       CHF   Breast cancer Paternal Aunt    Ovarian cancer Maternal Aunt    Diabetes Brother     Past Surgical History:  Procedure Laterality Date   TUBAL LIGATION  2004   Social History   Occupational History   Occupation: Retired    Comment: Research scientist (medical)  Tobacco Use    Smoking status: Never   Smokeless tobacco: Never  Vaping Use   Vaping status: Never Used  Substance and Sexual Activity   Alcohol use: No   Drug use: No   Sexual activity: Not on file

## 2024-04-07 NOTE — Addendum Note (Signed)
 Addended by: Michaele Offer on: 04/07/2024 03:48 PM   Modules accepted: Orders

## 2024-04-22 ENCOUNTER — Encounter: Payer: Self-pay | Admitting: Orthopaedic Surgery

## 2024-04-22 ENCOUNTER — Ambulatory Visit: Admitting: Orthopaedic Surgery

## 2024-04-22 DIAGNOSIS — M25511 Pain in right shoulder: Secondary | ICD-10-CM

## 2024-04-22 DIAGNOSIS — G8929 Other chronic pain: Secondary | ICD-10-CM

## 2024-04-22 NOTE — Progress Notes (Signed)
 I am much better.  She has been to PT and has improved.  She has little pain in the right shoulder.   She is sleeping better.  She has some pain with repeated overhead use.  She has no new trauma.  She has no numbness.  ROM of the right shoulder is full with slight tenderness at the extremes.  ROM of the neck is full.  NV intact  Encounter Diagnosis  Name Primary?   Chronic right shoulder pain Yes   Continue PT for two more weeks.  I have reviewed the PT notes.  Return in one month.  If doing well, call and cancel.  Call if any problem.  Precautions discussed.  Electronically Signed Pleasant Brilliant, MD 4/24/20258:40 AM

## 2024-05-20 ENCOUNTER — Ambulatory Visit: Admitting: Orthopaedic Surgery

## 2024-08-20 ENCOUNTER — Encounter: Payer: Self-pay | Admitting: Radiology

## 2024-11-01 ENCOUNTER — Encounter: Payer: Self-pay | Admitting: Radiology

## 2025-01-18 ENCOUNTER — Encounter (HOSPITAL_BASED_OUTPATIENT_CLINIC_OR_DEPARTMENT_OTHER): Payer: Self-pay | Admitting: Physician Assistant

## 2025-01-18 ENCOUNTER — Other Ambulatory Visit (HOSPITAL_BASED_OUTPATIENT_CLINIC_OR_DEPARTMENT_OTHER): Payer: Self-pay | Admitting: Physician Assistant

## 2025-01-18 ENCOUNTER — Ambulatory Visit (INDEPENDENT_AMBULATORY_CARE_PROVIDER_SITE_OTHER): Admitting: Physician Assistant

## 2025-01-18 ENCOUNTER — Ambulatory Visit (HOSPITAL_BASED_OUTPATIENT_CLINIC_OR_DEPARTMENT_OTHER)

## 2025-01-18 DIAGNOSIS — M79605 Pain in left leg: Secondary | ICD-10-CM

## 2025-01-18 MED ORDER — MELOXICAM 7.5 MG PO TABS: 7.5000 mg | ORAL_TABLET | Freq: Every day | ORAL | 0 refills | Status: AC

## 2025-01-18 NOTE — Progress Notes (Addendum)
 "  Office Visit Note   Patient: Latoya Horton           Date of Birth: 03-Jun-1941           MRN: [id number] Visit Date: 01/18/2025              Requested by: Hollis Alan ORN, MD [address] [place],  [place] [address] PCP: Hollis Alan ORN, MD   Assessment & Plan: Visit Diagnoses:  1. Pain in left leg     Plan: Patient is a pleasant 84 year old woman former patient of Dr. Anderson.  Has a history of some sciatica in the past.  Has been treated elsewhere with Medrol  Dosepak and did well.  Has not had any injury but has returned because she has left-sided back pain that radiates down into the posterior thigh.  She does lift her great granddaughter and wants to continue to do this who weighs about 28 pounds she is not sure if this aggravated it.  She denies any weakness any loss of bowel or bladder control and her exam is reassuring today.  Her x-ray does show listhesis at L4-5.  Her pain is not as bad as it was initially.  She has had a few rounds of steroids in the last few months for other medical issues.  I would like to avoid that for now if possible.  We talked about trying physical therapy she has worked at [facility] physical therapy for her shoulder in [place] and really enjoyed going there for PT will place a referral.  Also will call her in a 7-1/2 mg meloxicam  to take daily for the next few weeks.  She knows not to take other anti-inflammatories with it we will of course contact me if she does not improve could consider an MRI  Addendum: Radiology report came back on patient's lumbar spine.  It questions an L1 compression fracture.  It shows loss of height of L1.  I did compare this to her previous x-ray that she had of her lumbar spine in 2020 it did not have loss of height so I would have to agree that she may have sustained a compression fracture..  She also is continues to have pain.  I will order an MRI.  Patient in agreement with this plan  Follow-Up Instructions: Return if  symptoms worsen or fail to improve.   Orders:  Orders Placed This Encounter  Procedures   Ambulatory referral to Physical Therapy   Meds ordered this encounter  Medications   meloxicam  (MOBIC ) 7.5 MG tablet    Sig: Take 1 tablet (7.5 mg total) by mouth daily.    Dispense:  30 tablet    Refill:  0      Procedures: No procedures performed   Clinical Data: No additional findings.   Subjective: Chief Complaint  Patient presents with   Lower Back - Pain   Left Leg - Pain    HPI Pleasant active 84 year old woman who enjoys taking aerobics class and other activities comes in today complaining of left lower back pain.  She denies any particular injuries only lifting she does that significant is her great granddaughter who is about 27 pounds once in a while when she is taking care of her Review of Systems  All other systems reviewed and are negative.    Objective: Vital Signs: There were no vitals taken for this visit.  Physical Exam Constitutional:      Appearance: Normal appearance.  Pulmonary:  Effort: Pulmonary effort is normal.  Neurological:     General: No focal deficit present.     Mental Status: She is alert and oriented to person, place, and time.  Psychiatric:        Mood and Affect: Mood normal.        Behavior: Behavior normal.     Ortho Exam Examination of her about lower back no step-offs no crepitus she has some pain with forward flexion none with extension.  She has 5 out of 5 strength with resisted dorsiflexion and plantarflexion of both of her ankles extension and flexion of her legs.  Extension and flexion of her hips.  She does have a mildly positive straight leg raise which reproduces some of her pain on the left with the left leg.  Sensation is intact Specialty Comments:  No specialty comments available.  Imaging: No results found.   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   Tear of right rotator cuff 12/20/2014   White coat hypertension 10/10/2014   Epiretinal membrane 08/22/2014   Melanocytic nevi of eyelid or canthus 08/22/2014   Nuclear senile cataract 08/22/2014   Retinal drusen 08/22/2014   Colon polyp 10/18/2013   Cystocele, unspecified (CODE) 07/27/2013   Cough 11/05/2012   Pulmonary nodule 11/05/2012   Chest pain 06/17/2012   Bronchiectasis (HCC) 05/04/2012   Abnormal CXR 04/14/2012   Esophageal reflux 03/21/2009   Osteopenia 03/21/2009   Other specified cardiac arrhythmias 07/21/2007   Irritable bowel syndrome 05/28/2002   Past Medical History:  Diagnosis Date   Atypical mole 09/17/1999   minimal atypia on left upper breast (no treatment)   Basal cell carcinoma 03/25/1997   superficial on mid left back - CX3 + 5FU   Basal cell carcinoma 06/26/2005   superficial over left inner brow - CX3 + 5FU   Basal cell carcinoma 06/26/2005   superficial on left chest - MOHs   Basal cell carcinoma 09/11/2005   sclerosis w/ + margin - over left inner brow - MOHs   Basal cell carcinoma 06/04/2006   superficial on right upper back - CX3 + 5FU   Basal cell carcinoma 02/25/2012   superficial behind left ear - CX3 + 5FU   Basal cell carcinoma 02/24/2018   superficial on left anterior thigh - CX3+cautery+5FU   Basal cell carcinoma 03/02/2020   pigmented on left low abdomen - CX3+5FU   GERD (gastroesophageal reflux disease)    IBS (irritable bowel syndrome)    Multiple allergies    Scar tissue    of the lungs   Squamous cell carcinoma of skin 07/28/2006   KA on  right shin - parenteral 5FU   Squamous cell carcinoma of skin 07/23/2016   well differentiated on left inner knee - CX3+5FU    Family History  Problem Relation Age of Onset   Heart disease Father 1       Died age 80   Heart disease Brother 18       CHF   Breast cancer Paternal Aunt    Ovarian cancer  Maternal Aunt    Diabetes Brother     Past Surgical History:  Procedure Laterality Date   TUBAL LIGATION  2004   Social History   Occupational History   Occupation: Retired    Comment: research scientist (medical)  Tobacco Use   Smoking status: Never   Smokeless tobacco: Never  Vaping Use   Vaping status: Never Used  Substance and Sexual Activity   Alcohol use: No   Drug use: No   Sexual activity: Not on file        "

## 2025-01-21 ENCOUNTER — Telehealth: Payer: Self-pay | Admitting: Physician Assistant

## 2025-01-21 NOTE — Telephone Encounter (Signed)
 Spoke with patient advising to recheck temperature, if therm/temp still reading low install new batteries and if then still low to contact PCP or seek medical attention via nearest ED

## 2025-01-21 NOTE — Telephone Encounter (Signed)
 Pt called saying that she saw Latoya Horton on Tue.Her temp dropped to 95.1 last night and she drank hot cocoa to bring it back up. She wants to know if this is a concern or not. Call back number is [phone].

## 2025-01-25 ENCOUNTER — Other Ambulatory Visit: Payer: Self-pay | Admitting: Physician Assistant

## 2025-01-25 DIAGNOSIS — M5416 Radiculopathy, lumbar region: Secondary | ICD-10-CM

## 2025-01-25 DIAGNOSIS — M79605 Pain in left leg: Secondary | ICD-10-CM

## 2025-01-25 DIAGNOSIS — M4856XA Collapsed vertebra, not elsewhere classified, lumbar region, initial encounter for fracture: Secondary | ICD-10-CM

## 2025-01-26 ENCOUNTER — Ambulatory Visit
Admission: RE | Admit: 2025-01-26 | Discharge: 2025-01-26 | Disposition: A | Source: Ambulatory Visit | Attending: Physician Assistant

## 2025-01-26 DIAGNOSIS — M4856XA Collapsed vertebra, not elsewhere classified, lumbar region, initial encounter for fracture: Secondary | ICD-10-CM

## 2025-01-26 DIAGNOSIS — M5416 Radiculopathy, lumbar region: Secondary | ICD-10-CM

## 2025-02-03 ENCOUNTER — Telehealth: Payer: Self-pay | Admitting: Physician Assistant

## 2025-02-03 NOTE — Telephone Encounter (Signed)
 I spoke with the patient.  We discussed her MRI.  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  She also has other stenosis at L4-5 on the left.  This coordinates where her pain is.  Right now she said she is doing better and would like to work with physical therapy she does not have any weakness.  I told her at any time if she wished to go forward with an injection I could place a referral to Dr. Eldonna

## 2025-02-13 ENCOUNTER — Other Ambulatory Visit
